# Patient Record
Sex: Female | Born: 1950
Health system: Southern US, Community
[De-identification: ages and names within clinical notes are randomized; demographics above are authoritative.]

## PROBLEM LIST (undated history)

## (undated) DIAGNOSIS — E039 Hypothyroidism, unspecified: Secondary | ICD-10-CM

## (undated) DIAGNOSIS — I10 Essential (primary) hypertension: Secondary | ICD-10-CM

## (undated) DIAGNOSIS — J45909 Unspecified asthma, uncomplicated: Secondary | ICD-10-CM

## (undated) DIAGNOSIS — E7800 Pure hypercholesterolemia, unspecified: Secondary | ICD-10-CM

## (undated) DIAGNOSIS — R0600 Dyspnea, unspecified: Secondary | ICD-10-CM

## (undated) DIAGNOSIS — K219 Gastro-esophageal reflux disease without esophagitis: Secondary | ICD-10-CM

## (undated) DIAGNOSIS — R519 Headache, unspecified: Secondary | ICD-10-CM

## (undated) DIAGNOSIS — E079 Disorder of thyroid, unspecified: Secondary | ICD-10-CM

## (undated) DIAGNOSIS — Z9114 Patient's other noncompliance with medication regimen: Secondary | ICD-10-CM

## (undated) DIAGNOSIS — J189 Pneumonia, unspecified organism: Secondary | ICD-10-CM

## (undated) DIAGNOSIS — F419 Anxiety disorder, unspecified: Secondary | ICD-10-CM

## (undated) DIAGNOSIS — Z91148 Patient's other noncompliance with medication regimen for other reason: Secondary | ICD-10-CM

## (undated) HISTORY — PX: ABDOMINAL HYSTERECTOMY: SHX81

## (undated) HISTORY — PX: BREAST LUMPECTOMY: SHX2

## (undated) HISTORY — PX: BREAST SURGERY: SHX581

## (undated) HISTORY — PX: OTHER SURGICAL HISTORY: SHX169

---

## 2003-11-28 ENCOUNTER — Ambulatory Visit (HOSPITAL_COMMUNITY): Admission: RE | Admit: 2003-11-28 | Discharge: 2003-11-28 | Payer: Self-pay | Admitting: Family Medicine

## 2005-05-15 ENCOUNTER — Emergency Department (HOSPITAL_COMMUNITY): Admission: EM | Admit: 2005-05-15 | Discharge: 2005-05-15 | Payer: Self-pay | Admitting: Emergency Medicine

## 2006-02-13 ENCOUNTER — Emergency Department (HOSPITAL_COMMUNITY): Admission: EM | Admit: 2006-02-13 | Discharge: 2006-02-14 | Payer: Self-pay | Admitting: Emergency Medicine

## 2006-02-17 ENCOUNTER — Ambulatory Visit (HOSPITAL_COMMUNITY): Admission: RE | Admit: 2006-02-17 | Discharge: 2006-02-17 | Payer: Self-pay | Admitting: Family Medicine

## 2006-02-28 ENCOUNTER — Ambulatory Visit (HOSPITAL_COMMUNITY): Admission: RE | Admit: 2006-02-28 | Discharge: 2006-02-28 | Payer: Self-pay | Admitting: Family Medicine

## 2007-08-09 ENCOUNTER — Emergency Department (HOSPITAL_COMMUNITY): Admission: EM | Admit: 2007-08-09 | Discharge: 2007-08-09 | Payer: Self-pay | Admitting: Emergency Medicine

## 2007-09-22 ENCOUNTER — Ambulatory Visit (HOSPITAL_COMMUNITY): Admission: RE | Admit: 2007-09-22 | Discharge: 2007-09-22 | Payer: Self-pay | Admitting: Preventative Medicine

## 2008-03-22 ENCOUNTER — Ambulatory Visit (HOSPITAL_COMMUNITY): Admission: RE | Admit: 2008-03-22 | Discharge: 2008-03-22 | Payer: Self-pay | Admitting: Family Medicine

## 2008-04-09 ENCOUNTER — Encounter: Admission: RE | Admit: 2008-04-09 | Discharge: 2008-04-09 | Payer: Self-pay | Admitting: Family Medicine

## 2008-06-18 ENCOUNTER — Ambulatory Visit (HOSPITAL_COMMUNITY): Admission: RE | Admit: 2008-06-18 | Discharge: 2008-06-18 | Payer: Self-pay | Admitting: Family Medicine

## 2008-06-26 ENCOUNTER — Emergency Department (HOSPITAL_COMMUNITY): Admission: EM | Admit: 2008-06-26 | Discharge: 2008-06-26 | Payer: Self-pay | Admitting: Emergency Medicine

## 2008-09-16 ENCOUNTER — Ambulatory Visit (HOSPITAL_COMMUNITY): Admission: RE | Admit: 2008-09-16 | Discharge: 2008-09-16 | Payer: Self-pay | Admitting: Family Medicine

## 2008-09-19 ENCOUNTER — Ambulatory Visit (HOSPITAL_COMMUNITY): Admission: RE | Admit: 2008-09-19 | Discharge: 2008-09-19 | Payer: Self-pay | Admitting: Family Medicine

## 2009-05-06 ENCOUNTER — Inpatient Hospital Stay (HOSPITAL_COMMUNITY): Admission: EM | Admit: 2009-05-06 | Discharge: 2009-05-08 | Payer: Self-pay | Admitting: Emergency Medicine

## 2009-07-04 ENCOUNTER — Encounter: Admission: RE | Admit: 2009-07-04 | Discharge: 2009-07-04 | Payer: Self-pay | Admitting: Family Medicine

## 2010-01-08 ENCOUNTER — Encounter (INDEPENDENT_AMBULATORY_CARE_PROVIDER_SITE_OTHER): Payer: Self-pay | Admitting: *Deleted

## 2010-01-20 DIAGNOSIS — K219 Gastro-esophageal reflux disease without esophagitis: Secondary | ICD-10-CM | POA: Insufficient documentation

## 2010-01-20 DIAGNOSIS — R1310 Dysphagia, unspecified: Secondary | ICD-10-CM | POA: Insufficient documentation

## 2010-01-20 DIAGNOSIS — I10 Essential (primary) hypertension: Secondary | ICD-10-CM | POA: Insufficient documentation

## 2010-01-20 DIAGNOSIS — F411 Generalized anxiety disorder: Secondary | ICD-10-CM | POA: Insufficient documentation

## 2010-01-21 ENCOUNTER — Encounter: Payer: Self-pay | Admitting: Urgent Care

## 2010-01-21 ENCOUNTER — Ambulatory Visit: Payer: Self-pay | Admitting: Internal Medicine

## 2010-01-21 DIAGNOSIS — D509 Iron deficiency anemia, unspecified: Secondary | ICD-10-CM | POA: Insufficient documentation

## 2010-01-21 DIAGNOSIS — E7800 Pure hypercholesterolemia, unspecified: Secondary | ICD-10-CM | POA: Insufficient documentation

## 2010-01-21 DIAGNOSIS — E119 Type 2 diabetes mellitus without complications: Secondary | ICD-10-CM | POA: Insufficient documentation

## 2010-01-21 DIAGNOSIS — K921 Melena: Secondary | ICD-10-CM | POA: Insufficient documentation

## 2010-01-22 ENCOUNTER — Encounter: Payer: Self-pay | Admitting: Urgent Care

## 2010-01-26 ENCOUNTER — Ambulatory Visit: Payer: Self-pay | Admitting: Internal Medicine

## 2010-01-26 ENCOUNTER — Ambulatory Visit (HOSPITAL_COMMUNITY): Admission: RE | Admit: 2010-01-26 | Discharge: 2010-01-26 | Payer: Self-pay | Admitting: Internal Medicine

## 2010-01-26 LAB — CONVERTED CEMR LAB
Basophils Absolute: 0 10*3/uL (ref 0.0–0.1)
Basophils Relative: 0 % (ref 0–1)
Eosinophils Absolute: 0.2 10*3/uL (ref 0.0–0.7)
Eosinophils Relative: 3 % (ref 0–5)
HCT: 30.9 % — ABNORMAL LOW (ref 36.0–46.0)
Hemoglobin: 9.8 g/dL — ABNORMAL LOW (ref 12.0–15.0)
Lymphocytes Relative: 30 % (ref 12–46)
Lymphs Abs: 1.9 10*3/uL (ref 0.7–4.0)
MCHC: 31.7 g/dL (ref 30.0–36.0)
MCV: 82.4 fL (ref 78.0–100.0)
Monocytes Absolute: 0.3 10*3/uL (ref 0.1–1.0)
Monocytes Relative: 5 % (ref 3–12)
Neutro Abs: 4 10*3/uL (ref 1.7–7.7)
Neutrophils Relative %: 62 % (ref 43–77)
Platelets: 284 10*3/uL (ref 150–400)
RBC: 3.75 M/uL — ABNORMAL LOW (ref 3.87–5.11)
RDW: 14.2 % (ref 11.5–15.5)
WBC: 6.5 10*3/uL (ref 4.0–10.5)

## 2010-01-29 ENCOUNTER — Encounter: Payer: Self-pay | Admitting: Internal Medicine

## 2010-02-02 ENCOUNTER — Telehealth (INDEPENDENT_AMBULATORY_CARE_PROVIDER_SITE_OTHER): Payer: Self-pay

## 2010-02-24 ENCOUNTER — Emergency Department (HOSPITAL_COMMUNITY): Admission: EM | Admit: 2010-02-24 | Discharge: 2010-02-24 | Payer: Self-pay | Admitting: Emergency Medicine

## 2010-05-14 ENCOUNTER — Telehealth: Payer: Self-pay | Admitting: Gastroenterology

## 2010-05-14 ENCOUNTER — Ambulatory Visit (HOSPITAL_COMMUNITY): Admission: RE | Admit: 2010-05-14 | Discharge: 2010-05-14 | Payer: Self-pay | Admitting: Family Medicine

## 2010-05-15 ENCOUNTER — Ambulatory Visit (HOSPITAL_COMMUNITY): Admission: RE | Admit: 2010-05-15 | Discharge: 2010-05-15 | Payer: Self-pay | Admitting: Family Medicine

## 2010-05-18 ENCOUNTER — Encounter: Payer: Self-pay | Admitting: Internal Medicine

## 2010-05-19 ENCOUNTER — Emergency Department (HOSPITAL_COMMUNITY): Admission: EM | Admit: 2010-05-19 | Discharge: 2010-05-19 | Payer: Self-pay | Admitting: Emergency Medicine

## 2010-05-26 ENCOUNTER — Encounter (INDEPENDENT_AMBULATORY_CARE_PROVIDER_SITE_OTHER): Payer: Self-pay

## 2010-06-12 ENCOUNTER — Encounter (INDEPENDENT_AMBULATORY_CARE_PROVIDER_SITE_OTHER): Payer: Self-pay | Admitting: *Deleted

## 2010-10-19 ENCOUNTER — Emergency Department (HOSPITAL_COMMUNITY): Admission: EM | Admit: 2010-10-19 | Discharge: 2010-10-19 | Payer: Self-pay | Admitting: Emergency Medicine

## 2010-12-02 ENCOUNTER — Emergency Department (HOSPITAL_COMMUNITY)
Admission: EM | Admit: 2010-12-02 | Discharge: 2010-12-02 | Payer: Self-pay | Source: Home / Self Care | Admitting: Emergency Medicine

## 2010-12-07 LAB — BASIC METABOLIC PANEL
BUN: 7 mg/dL (ref 6–23)
CO2: 24 mEq/L (ref 19–32)
Calcium: 8.9 mg/dL (ref 8.4–10.5)
Chloride: 105 mEq/L (ref 96–112)
Creatinine, Ser: 0.7 mg/dL (ref 0.4–1.2)
GFR calc Af Amer: >60 mL/min (ref >60)
GFR calc non Af Amer: >60 mL/min (ref >60)
Glucose, Bld: 141 mg/dL — ABNORMAL HIGH (ref 70–99)
Potassium: 3.5 mEq/L (ref 3.5–5.1)
Sodium: 139 mEq/L (ref 135–145)

## 2010-12-07 LAB — DIFFERENTIAL
Basophils Absolute: 0 10*3/uL (ref 0.0–0.1)
Basophils Relative: 0 % (ref 0–1)
Eosinophils Absolute: 0.1 10*3/uL (ref 0.0–0.7)
Eosinophils Relative: 2 % (ref 0–5)
Lymphocytes Relative: 34 % (ref 12–46)
Lymphs Abs: 2.2 10*3/uL (ref 0.7–4.0)
Monocytes Absolute: 0.4 10*3/uL (ref 0.1–1.0)
Monocytes Relative: 6 % (ref 3–12)
Neutro Abs: 3.9 10*3/uL (ref 1.7–7.7)
Neutrophils Relative %: 59 % (ref 43–77)

## 2010-12-07 LAB — CBC
HCT: 28.4 % — ABNORMAL LOW (ref 36.0–46.0)
Hemoglobin: 9.6 g/dL — ABNORMAL LOW (ref 12.0–15.0)
MCH: 26.7 pg (ref 26.0–34.0)
MCHC: 33.8 g/dL (ref 30.0–36.0)
MCV: 79.1 fL (ref 78.0–100.0)
Platelets: 259 10*3/uL (ref 150–400)
RBC: 3.59 MIL/uL — ABNORMAL LOW (ref 3.87–5.11)
RDW: 14.7 % (ref 11.5–15.5)
WBC: 6.6 10*3/uL (ref 4.0–10.5)

## 2010-12-07 LAB — GLUCOSE, CAPILLARY: Glucose-Capillary: 174 mg/dL — ABNORMAL HIGH (ref 70–99)

## 2010-12-24 NOTE — Letter (Signed)
Summary: Appointment Reminder  [facility]  [address]   [place], [place] [address]   Phone: [phone]  Fax: [phone]       January 08, 2010   MADDALYN LUTZE [address] Rhina Brackett [place], [place]  [address] [birth year]    Dear Ms. Bolduc,  We have been unable to reach you by phone to schedule a follow up   appointment that was recommended for you by Dr. Jena Gauss. It is very   important that we reach you to schedule an appointment. We hope that you  allow [place] to participate in your health care needs. Please contact us at  [phone] at your earliest convenience to schedule your appointment.  Sincerely,    [facility] R. Roetta Sessions, M.D.    Kassie Mends, M.D. Lorenza Burton, FNP-BC    Tana Coast, PA-C Phone: [phone]    Fax: [phone]

## 2010-12-24 NOTE — Letter (Signed)
Summary: tcs/egd order  tcs/egd order   Imported By: Ave Filter 01/21/2010 11:39:51  _____________________________________________________________________  External Attachment:    Type:   Image     Comment:   External Document

## 2010-12-24 NOTE — Assessment & Plan Note (Signed)
Summary: e30,heme+stool,glu   Visit Type:  Initial Consult Referring Provider:  Sherwood Gambler Primary Care Provider:  [place]  Chief Complaint:  heme positive stool.  History of Present Illness: 60 y/o caucasian female referred for anemia/heme positive stool.  Last hgb 10.2, normal MCV on 11/11/2009.  Started iron x 2 weeks now.  Found heme positive on cards from Dr. Sharyon Medicus office.  Denies abd pain, nausea, vomiting.  Daily heartburn & indigestion.  Had been on PPI yrs ago, but got too expensive.  Takes TUMS.  Occ regurg.  c/o dysphagia w/ liquids, feels like comes back up.  Denies any problems w/ solids.  BM normal 2-3 per day, denies rectal bleeding or melena.  Never had colonoscopy.  Wt steadily increasing.  c/o L4-5 bulging disc w/ sciatica.  Takes Arthritis BC's at least 3-4 times per week.  Aleve 1 couple times per wk.  hx anemia w/ menses, but not recently.  Gives blood regularly, tried last yr, was told iron was low.  Never had transfusion.  c/o abd bloating.  B12, folate, TSH normal iron 37 UIBC 441, TIBC 478 (low), %sat( low) 8, hgb A1c 6.9 CMP normal x glucose 133  Current Problems (verified): 1)  Hypercholesterolemia  (ICD-272.0) 2)  Dm  (ICD-250.00) 3)  Hemoccult Positive Stool  (ICD-578.1) 4)  Anemia, Iron Deficiency  (ICD-280.9) 5)  Anxiety Neurosis  (ICD-300.00) 6)  Hypertension  (ICD-401.9) 7)  Gerd  (ICD-530.81) 8)  Dysphagia Unspecified  (ICD-787.20)  Current Medications (verified): 1)  Hydrocodone-Acetaminophen 10-500 Mg Tabs (Hydrocodone-Acetaminophen) .... As Needed 2)  Tricor 145 Mg Tabs (Fenofibrate) .... Take 1 Tablet By Mouth Once A Day 3)  Alprazolam 0.5 Mg Tabs (Alprazolam) .... As Needed 4)  Lexapro 10 Mg Tabs (Escitalopram Oxalate) .... Take 1 Tablet By Mouth Once A Day 5)  Neurontin 100 Mg Caps (Gabapentin) .... Take 1 Tablet By Mouth Three Times A Day 6)  Methocarbamol 500 Mg Tabs (Methocarbamol) .... As Needed 7)  Amlodipine Besylate 5 Mg Tabs (Amlodipine  Besylate) .... Take 1 Tablet By Mouth Once A Day 8)  Metformin Hcl 500 Mg Tabs (Metformin Hcl) .... Two Tablets in The Am and Two Tablets in The Pm 9)  Benicar Hct 40-12.5 Mg Tabs (Olmesartan Medoxomil-Hctz) .... Take 1 Tablet By Mouth Once A Day 10)  Zetia 10 Mg Tabs (Ezetimibe) .... Take 1 Tablet By Mouth Once A Day 11)  Zolpidem Tartrate 10 Mg Tabs (Zolpidem Tartrate) .... As Needed 12)  Aleve 220 Mg Tabs (Naproxen Sodium) .Marland Kitchen.. 1 Qdaily As Needed For Arthritis 13)  Bc Fast Pain Relief Arthritis 742-222-38 Mg Pack (Aspirin-Salicylamide-Caffeine) .Marland Kitchen.. 1-2 Per Day As Needed For Knee Pain 14)  Tums 500 Mg Chew (Calcium Carbonate Antacid) .... Prn 15)  Iron Supplement 325 (65 Fe) Mg Tabs (Ferrous Sulfate) .... One By Mouth Daily  Allergies (verified): 1)  ! Sulfa  Past History:  Past Medical History: HYPERCHOLESTEROLEMIA (ICD-272.0) DM (ICD-250.00) HEMOCCULT POSITIVE STOOL (ICD-578.1) ANEMIA, IRON DEFICIENCY (ICD-280.9) ANXIETY NEUROSIS (ICD-300.00) HYPERTENSION (ICD-401.9) GERD (ICD-530.81) DYSPHAGIA UNSPECIFIED (ICD-787.20) EGD 1998->clo test negative, Schatski ring, small antral erosions  Past Surgical History: C-SECTIONS X 2 BREAST CYST REMOVAL, benign left ankle surgery  Family History: No known family history of colorectal carcinoma 1 daughter w/ ?autoimmune hepatitis Father: (deceased 31) CAD, alcohol abuse, DM Mother: (61) Crohn's disease Siblings: 3  brothers-htn, colon polyps 2 sisters-healthy  Social History: widow, lives alone [employer] full-time 1 daughter, 1 son-healthy Patient has never smoked.  Alcohol Use - no Daily Caffeine  Use Illicit Drug Use - no Patient does not get regular exercise.  Smoking Status:  never Drug Use:  no Does Patient Exercise:  no  Review of Systems General:  Denies fever, chills, sweats, anorexia, fatigue, weakness, malaise, weight loss, and sleep disorder. CV:  Denies chest pains, angina, palpitations, syncope, dyspnea on  exertion, orthopnea, PND, peripheral edema, and claudication. Resp:  Denies dyspnea at rest, dyspnea with exercise, cough, sputum, wheezing, coughing up blood, and pleurisy. GI:  Denies vomiting blood, abdominal pain, jaundice, change in bowel habits, and fecal incontinence. GU:  Denies urinary burning, blood in urine, nocturnal urination, urinary frequency, urinary incontinence, and abnormal vaginal bleeding; post-menopausal. Derm:  Denies rash, itching, dry skin, hives, moles, warts, and unhealing ulcers. Psych:  Denies depression, anxiety, memory loss, suicidal ideation, hallucinations, paranoia, phobia, and confusion. Heme:  Denies bruising, bleeding, and enlarged lymph nodes.  Vital Signs:  Patient profile:   60 year old female Height:      59 inches Weight:      185 pounds BMI:     37.50 Temp:     98.2 degrees F oral Pulse rate:   80 / minute BP sitting:   160 / 90  (left arm) Cuff size:   large  Vitals Entered By: Cloria Spring LPN (January 21, 9561 10:28 AM)   Physical Exam  General:  obese.  Well developed, no acute distress. Head:  Normocephalic and atraumatic. Eyes:  Sclera clear, no icterus. Ears:  Normal auditory acuity. Nose:  No deformity, discharge,  or lesions. Mouth:  No deformity or lesions, dentition normal. Neck:  Supple; no masses or thyromegaly. Lungs:  Clear throughout to auscultation. Heart:  Regular rate and rhythm; no murmurs, rubs,  or bruits. Abdomen:  Rash around mid-abd w/ scaling.  Soft, nontender and nondistended. No masses, hepatosplenomegaly or hernias noted. Normal bowel sounds.obese, without guarding, and without rebound.  exam limited given body habitus Msk:  Symmetrical with no gross deformities. Normal posture. Pulses:  Normal pulses noted. Extremities:  1+ pedal edema.   Neurologic:  Alert and  oriented x4;  grossly normal neurologically. Skin:  Intact without significant lesions or rashes. Cervical Nodes:  No significant cervical  adenopathy. Psych:  Alert and cooperative. Normal mood and affect.  Impression & Recommendations:  Problem # 1:  ANEMIA, IRON DEFICIENCY (ICD-280.9) 60 y/o obese caucasian female w/ anemia, hemoccult positive stool & GERD not on PPI.  Frequent NSAIDs.  Will need further evaluation to r/o NSAID-induced enteropathy, occult malignancy, & PUD.    Colonoscopy plus/minus EGD to be performed by Dr. Jonathon Bellows in the near future.  I have discussed risks and benefits which include, but are not limited to, bleeding, infection, perforation, or medication reaction.  The patient agrees with this plan and consent will be obtained.  Orders: T-CBC w/Diff ([id number]) Consultation Level III ([address])  Problem # 2:  HEMOCCULT POSITIVE STOOL (ICD-578.1) See #1  Problem # 3:  GERD (ICD-530.81) See #1  Patient Instructions: 1)  Go get your labs today 2)  Start prilosec 20 mg daily (samples given) 30 mins before breakfast for acid reflux 3)  No iron for 7 days before your procedure 4)  Change back to your regular detergents, if rash persists let Dr Sherwood Gambler know 5)  See Dr Sherwood Gambler regarding your swelling in your lower legs 6)  No ALEVE, IBUPROFEN, GOODYs, BC powders for now. 7)  The medication list was reviewed and reconciled.  All changed / newly prescribed medications  were explained.  A complete medication list was provided to the patient / caregiver.
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
## 2010-12-24 NOTE — Letter (Signed)
Summary: Recall Colonoscopy/Endoscopy, Change to Office Visit  [facility]  [address]   [place], [place] [address]   Phone: [phone]  Fax: [phone]      May 26, 2010   [name] [address] [place], [place]  [address] 1951-03-14   Dear Ms. Meter,   According to our records, it is time for you to schedule a Colonoscopy/Endoscopy. However, after reviewing your medical record, we recommend an office visit in order to determine your need for a repeat procedure.  Please call [phone] at your convenience to schedule an office visit. If you have any questions or concerns, please feel free to contact our office.   Sincerely,   Cloria Spring LPN  [facility] Ph: [phone]   Fax: [phone]

## 2010-12-24 NOTE — Letter (Signed)
Summary: Generic Letter, Intro to Referring  [facility]  [address]   [place], [place] [address]   Phone: [phone]  Fax: [phone]      June 12, 2010                RE: [name]   [birth year]                 [address]                 [place], [place]  [address]   Dear Ms. Bene,   Our office has been unable to contact you by phone.  Your primary care doctor would like you to schedule an appointment with [place].  Please contact our office at [phone].             Sincerely,    Elinor Parkinson  [facility] Ph: [phone]   Fax: [phone]

## 2010-12-24 NOTE — Letter (Signed)
Summary: [facility]  [facility]   Imported By: Diana Eves 01/22/2010 09:54:30  _____________________________________________________________________  External Attachment:    Type:   Image     Comment:   External Document

## 2010-12-24 NOTE — Letter (Signed)
Summary: Patient Notice, Endo Biopsy Results  [facility]  [address]   [place], [place] [address]   Phone: [phone]  Fax: [phone]       January 29, 2010   [name] [address] [place], [place]  [address] 01-09-1951    Dear [name],  I am pleased to inform you that the biopsies taken during your recent endoscopic examination did not show any evidence of cancer upon pathologic examination.  They did show inflammation but no evidence of infection.  Additional information/recommendations:  Continue with the treatment plan as outlined on the day of your exam.  You should have a repeat endoscopic examination in 3 months.   Please call us if you are having persistent problems or have questions about your condition that have not been fully answered at this time.  Sincerely,    R. Roetta Sessions MD  [facility] Ph: [phone]   Fax: [phone]   Appended Document: Patient Notice, Endo Biopsy Results Letter mailed.

## 2010-12-24 NOTE — Progress Notes (Signed)
----   Converted from flag ---- ---- 02/02/2010 1:58 PM, Jonathon Bellows MD wrote: for screening purposes, would be 10 years.  ---- 02/02/2010 10:16 AM, Cloria Spring LPN wrote: Dr. Jena Gauss, when should pt have next TCS? ------------------------------

## 2010-12-24 NOTE — Letter (Signed)
Summary: radiology report  radiology report   Imported By: Rosine Beat 05/18/2010 10:35:30  _____________________________________________________________________  External Attachment:    Type:   Image     Comment:   External Document

## 2011-01-07 ENCOUNTER — Other Ambulatory Visit (HOSPITAL_COMMUNITY): Payer: Self-pay | Admitting: Internal Medicine

## 2011-01-07 DIAGNOSIS — N83209 Unspecified ovarian cyst, unspecified side: Secondary | ICD-10-CM

## 2011-01-11 ENCOUNTER — Other Ambulatory Visit (HOSPITAL_COMMUNITY): Payer: Self-pay

## 2011-01-14 ENCOUNTER — Ambulatory Visit (HOSPITAL_COMMUNITY): Admission: RE | Admit: 2011-01-14 | Payer: BC Managed Care – PPO | Source: Ambulatory Visit

## 2011-01-15 ENCOUNTER — Ambulatory Visit (HOSPITAL_COMMUNITY)
Admission: RE | Admit: 2011-01-15 | Discharge: 2011-01-15 | Disposition: A | Discharge status: Home / Self Care | Payer: BC Managed Care – PPO | Source: Ambulatory Visit | Attending: Internal Medicine | Admitting: Internal Medicine

## 2011-01-15 DIAGNOSIS — N83209 Unspecified ovarian cyst, unspecified side: Secondary | ICD-10-CM | POA: Insufficient documentation

## 2011-01-15 DIAGNOSIS — R9389 Abnormal findings on diagnostic imaging of other specified body structures: Secondary | ICD-10-CM | POA: Insufficient documentation

## 2011-01-19 ENCOUNTER — Emergency Department (HOSPITAL_COMMUNITY)
Admission: EM | Admit: 2011-01-19 | Discharge: 2011-01-19 | Disposition: A | Discharge status: Home / Self Care | Payer: BC Managed Care – PPO | Attending: Emergency Medicine | Admitting: Emergency Medicine

## 2011-01-19 ENCOUNTER — Emergency Department (HOSPITAL_COMMUNITY): Payer: BC Managed Care – PPO

## 2011-01-19 DIAGNOSIS — Z79899 Other long term (current) drug therapy: Secondary | ICD-10-CM | POA: Insufficient documentation

## 2011-01-19 DIAGNOSIS — I10 Essential (primary) hypertension: Secondary | ICD-10-CM | POA: Insufficient documentation

## 2011-01-19 DIAGNOSIS — F410 Panic disorder [episodic paroxysmal anxiety] without agoraphobia: Secondary | ICD-10-CM | POA: Insufficient documentation

## 2011-01-19 DIAGNOSIS — E119 Type 2 diabetes mellitus without complications: Secondary | ICD-10-CM | POA: Insufficient documentation

## 2011-01-19 DIAGNOSIS — R42 Dizziness and giddiness: Secondary | ICD-10-CM | POA: Insufficient documentation

## 2011-01-19 DIAGNOSIS — E785 Hyperlipidemia, unspecified: Secondary | ICD-10-CM | POA: Insufficient documentation

## 2011-01-19 DIAGNOSIS — R4789 Other speech disturbances: Secondary | ICD-10-CM | POA: Insufficient documentation

## 2011-01-19 DIAGNOSIS — R209 Unspecified disturbances of skin sensation: Secondary | ICD-10-CM | POA: Insufficient documentation

## 2011-01-19 DIAGNOSIS — M549 Dorsalgia, unspecified: Secondary | ICD-10-CM | POA: Insufficient documentation

## 2011-01-19 LAB — COMPREHENSIVE METABOLIC PANEL
ALT: 14 U/L (ref 0–35)
AST: 21 U/L (ref 0–37)
Albumin: 4.2 g/dL (ref 3.5–5.2)
Alkaline Phosphatase: 72 U/L (ref 39–117)
BUN: 14 mg/dL (ref 6–23)
CO2: 21 mEq/L (ref 19–32)
Calcium: 9.7 mg/dL (ref 8.4–10.5)
Chloride: 109 mEq/L (ref 96–112)
Creatinine, Ser: 0.91 mg/dL (ref 0.4–1.2)
GFR calc Af Amer: >60 mL/min (ref >60)
GFR calc non Af Amer: >60 mL/min (ref >60)
Glucose, Bld: 134 mg/dL — ABNORMAL HIGH (ref 70–99)
Potassium: 3.2 mEq/L — ABNORMAL LOW (ref 3.5–5.1)
Sodium: 143 mEq/L (ref 135–145)
Total Bilirubin: 0.3 mg/dL (ref 0.3–1.2)
Total Protein: 7.8 g/dL (ref 6.0–8.3)

## 2011-01-19 LAB — PROTIME-INR
INR: 0.97 (ref 0.00–1.49)
Prothrombin Time: 13.1 seconds (ref 11.6–15.2)

## 2011-01-19 LAB — CBC
HCT: 35.2 % — ABNORMAL LOW (ref 36.0–46.0)
Hemoglobin: 11.5 g/dL — ABNORMAL LOW (ref 12.0–15.0)
MCH: 26 pg (ref 26.0–34.0)
MCHC: 32.7 g/dL (ref 30.0–36.0)
MCV: 79.6 fL (ref 78.0–100.0)
Platelets: 367 10*3/uL (ref 150–400)
RBC: 4.42 MIL/uL (ref 3.87–5.11)
RDW: 15.2 % (ref 11.5–15.5)
WBC: 9.4 10*3/uL (ref 4.0–10.5)

## 2011-01-19 LAB — DIFFERENTIAL
Basophils Absolute: 0 10*3/uL (ref 0.0–0.1)
Basophils Relative: 0 % (ref 0–1)
Eosinophils Absolute: 0 10*3/uL (ref 0.0–0.7)
Eosinophils Relative: 0 % (ref 0–5)
Lymphocytes Relative: 30 % (ref 12–46)
Lymphs Abs: 2.8 10*3/uL (ref 0.7–4.0)
Monocytes Absolute: 0.7 10*3/uL (ref 0.1–1.0)
Monocytes Relative: 8 % (ref 3–12)
Neutro Abs: 5.9 10*3/uL (ref 1.7–7.7)
Neutrophils Relative %: 62 % (ref 43–77)

## 2011-01-19 LAB — POCT CARDIAC MARKERS
CKMB, poc: <1 ng/mL — ABNORMAL LOW (ref 1.0–8.0)
Myoglobin, poc: 92.6 ng/mL (ref 12–200)
Troponin i, poc: <0.05 ng/mL (ref 0.00–0.09)

## 2011-01-19 LAB — URINALYSIS, ROUTINE W REFLEX MICROSCOPIC
Bilirubin Urine: NEGATIVE
Hgb urine dipstick: NEGATIVE
Ketones, ur: NEGATIVE mg/dL
Nitrite: NEGATIVE
Protein, ur: NEGATIVE mg/dL
Specific Gravity, Urine: >1.03 — ABNORMAL HIGH (ref 1.005–1.030)
Urine Glucose, Fasting: NEGATIVE mg/dL
Urobilinogen, UA: 0.2 mg/dL (ref 0.0–1.0)
pH: 5 (ref 5.0–8.0)

## 2011-01-19 LAB — APTT: aPTT: 27 seconds (ref 24–37)

## 2011-02-02 LAB — POCT I-STAT, CHEM 8
BUN: 13 mg/dL (ref 6–23)
Calcium, Ion: 1.14 mmol/L (ref 1.12–1.32)
Chloride: 106 mEq/L (ref 96–112)
Creatinine, Ser: 0.7 mg/dL (ref 0.4–1.2)
Glucose, Bld: 152 mg/dL — ABNORMAL HIGH (ref 70–99)
HCT: 31 % — ABNORMAL LOW (ref 36.0–46.0)
Hemoglobin: 10.5 g/dL — ABNORMAL LOW (ref 12.0–15.0)
Potassium: 3.6 mEq/L (ref 3.5–5.1)
Sodium: 142 mEq/L (ref 135–145)
TCO2: 25 mmol/L (ref 0–100)

## 2011-02-07 LAB — BASIC METABOLIC PANEL
BUN: 27 mg/dL — ABNORMAL HIGH (ref 6–23)
CO2: 22 mEq/L (ref 19–32)
Calcium: 9.9 mg/dL (ref 8.4–10.5)
Chloride: 104 mEq/L (ref 96–112)
Creatinine, Ser: 1.13 mg/dL (ref 0.4–1.2)
GFR calc Af Amer: 60 mL/min — ABNORMAL LOW (ref >60)
GFR calc non Af Amer: 49 mL/min — ABNORMAL LOW (ref >60)
Glucose, Bld: 299 mg/dL — ABNORMAL HIGH (ref 70–99)
Potassium: 4 mEq/L (ref 3.5–5.1)
Sodium: 136 mEq/L (ref 135–145)

## 2011-02-07 LAB — ETHANOL: Alcohol, Ethyl (B): <5 mg/dL (ref 0–10)

## 2011-02-07 LAB — DIFFERENTIAL
Basophils Absolute: 0 10*3/uL (ref 0.0–0.1)
Basophils Relative: 0 % (ref 0–1)
Eosinophils Absolute: 0.1 10*3/uL (ref 0.0–0.7)
Eosinophils Relative: 1 % (ref 0–5)
Lymphocytes Relative: 15 % (ref 12–46)
Lymphs Abs: 1.4 10*3/uL (ref 0.7–4.0)
Monocytes Absolute: 0.3 10*3/uL (ref 0.1–1.0)
Monocytes Relative: 3 % (ref 3–12)
Neutro Abs: 8.1 10*3/uL — ABNORMAL HIGH (ref 1.7–7.7)
Neutrophils Relative %: 82 % — ABNORMAL HIGH (ref 43–77)

## 2011-02-07 LAB — CBC
HCT: 31.8 % — ABNORMAL LOW (ref 36.0–46.0)
Hemoglobin: 10.7 g/dL — ABNORMAL LOW (ref 12.0–15.0)
MCH: 27.2 pg (ref 26.0–34.0)
MCHC: 33.5 g/dL (ref 30.0–36.0)
MCV: 81 fL (ref 78.0–100.0)
Platelets: 329 10*3/uL (ref 150–400)
RBC: 3.92 MIL/uL (ref 3.87–5.11)
RDW: 15.7 % — ABNORMAL HIGH (ref 11.5–15.5)
WBC: 9.9 10*3/uL (ref 4.0–10.5)

## 2011-02-15 LAB — GLUCOSE, CAPILLARY: Glucose-Capillary: 164 mg/dL — ABNORMAL HIGH (ref 70–99)

## 2011-03-01 ENCOUNTER — Encounter (HOSPITAL_COMMUNITY): Payer: BC Managed Care – PPO

## 2011-03-01 LAB — RETICULOCYTES
RBC.: 3.62 MIL/uL — ABNORMAL LOW (ref 3.87–5.11)
Retic Count, Absolute: 54.3 10*3/uL (ref 19.0–186.0)
Retic Ct Pct: 1.5 % (ref 0.4–3.1)

## 2011-03-01 LAB — URINALYSIS, ROUTINE W REFLEX MICROSCOPIC
Bilirubin Urine: NEGATIVE
Bilirubin Urine: NEGATIVE
Glucose, UA: NEGATIVE mg/dL
Glucose, UA: NEGATIVE mg/dL
Hgb urine dipstick: NEGATIVE
Hgb urine dipstick: NEGATIVE
Ketones, ur: NEGATIVE mg/dL
Ketones, ur: NEGATIVE mg/dL
Nitrite: NEGATIVE
Nitrite: NEGATIVE
Protein, ur: NEGATIVE mg/dL
Specific Gravity, Urine: 1.025 (ref 1.005–1.030)
Specific Gravity, Urine: 1.02 (ref 1.005–1.030)
Urobilinogen, UA: 0.2 mg/dL (ref 0.0–1.0)
Urobilinogen, UA: 0.2 mg/dL (ref 0.0–1.0)
pH: 5.5 (ref 5.0–8.0)
pH: 5 (ref 5.0–8.0)

## 2011-03-01 LAB — CBC
HCT: 35.1 % — ABNORMAL LOW (ref 36.0–46.0)
HCT: 26.7 % — ABNORMAL LOW (ref 36.0–46.0)
HCT: 27.2 % — ABNORMAL LOW (ref 36.0–46.0)
Hemoglobin: 11.6 g/dL — ABNORMAL LOW (ref 12.0–15.0)
Hemoglobin: 9.2 g/dL — ABNORMAL LOW (ref 12.0–15.0)
Hemoglobin: 9.3 g/dL — ABNORMAL LOW (ref 12.0–15.0)
MCH: 26.2 pg (ref 26.0–34.0)
MCHC: 33 g/dL (ref 30.0–36.0)
MCHC: 34.4 g/dL (ref 30.0–36.0)
MCHC: 34.3 g/dL (ref 30.0–36.0)
MCV: 79.4 fL (ref 78.0–100.0)
MCV: 77.4 fL — ABNORMAL LOW (ref 78.0–100.0)
MCV: 77.7 fL — ABNORMAL LOW (ref 78.0–100.0)
Platelets: 266 10*3/uL (ref 150–400)
Platelets: 191 10*3/uL (ref 150–400)
Platelets: 214 10*3/uL (ref 150–400)
RBC: 4.42 MIL/uL (ref 3.87–5.11)
RBC: 3.44 MIL/uL — ABNORMAL LOW (ref 3.87–5.11)
RBC: 3.5 MIL/uL — ABNORMAL LOW (ref 3.87–5.11)
RDW: 15.3 % (ref 11.5–15.5)
RDW: 15.7 % — ABNORMAL HIGH (ref 11.5–15.5)
RDW: 15.4 % (ref 11.5–15.5)
WBC: 7.1 10*3/uL (ref 4.0–10.5)
WBC: 6.9 10*3/uL (ref 4.0–10.5)
WBC: 8.6 10*3/uL (ref 4.0–10.5)

## 2011-03-01 LAB — URINE CULTURE: Colony Count: 9000

## 2011-03-01 LAB — GLUCOSE, CAPILLARY
Glucose-Capillary: 128 mg/dL — ABNORMAL HIGH (ref 70–99)
Glucose-Capillary: 144 mg/dL — ABNORMAL HIGH (ref 70–99)
Glucose-Capillary: 162 mg/dL — ABNORMAL HIGH (ref 70–99)
Glucose-Capillary: 166 mg/dL — ABNORMAL HIGH (ref 70–99)
Glucose-Capillary: 166 mg/dL — ABNORMAL HIGH (ref 70–99)
Glucose-Capillary: 243 mg/dL — ABNORMAL HIGH (ref 70–99)
Glucose-Capillary: 117 mg/dL — ABNORMAL HIGH (ref 70–99)
Glucose-Capillary: 140 mg/dL — ABNORMAL HIGH (ref 70–99)

## 2011-03-01 LAB — FERRITIN

## 2011-03-01 LAB — COMPREHENSIVE METABOLIC PANEL
ALT: 14 U/L (ref 0–35)
ALT: 36 U/L — ABNORMAL HIGH (ref 0–35)
AST: 15 U/L (ref 0–37)
AST: 50 U/L — ABNORMAL HIGH (ref 0–37)
Albumin: 4 g/dL (ref 3.5–5.2)
Albumin: 3.6 g/dL (ref 3.5–5.2)
Alkaline Phosphatase: 72 U/L (ref 39–117)
Alkaline Phosphatase: 84 U/L (ref 39–117)
BUN: 12 mg/dL (ref 6–23)
BUN: 21 mg/dL (ref 6–23)
CO2: 28 mEq/L (ref 19–32)
CO2: 25 mEq/L (ref 19–32)
Calcium: 9.7 mg/dL (ref 8.4–10.5)
Calcium: 8.5 mg/dL (ref 8.4–10.5)
Chloride: 99 mEq/L (ref 96–112)
Chloride: 102 mEq/L (ref 96–112)
Creatinine, Ser: 0.71 mg/dL (ref 0.4–1.2)
Creatinine, Ser: 1.23 mg/dL — ABNORMAL HIGH (ref 0.4–1.2)
GFR calc Af Amer: >60 mL/min (ref >60)
GFR calc Af Amer: 54 mL/min — ABNORMAL LOW (ref >60)
GFR calc non Af Amer: >60 mL/min (ref >60)
GFR calc non Af Amer: 45 mL/min — ABNORMAL LOW (ref >60)
Glucose, Bld: 182 mg/dL — ABNORMAL HIGH (ref 70–99)
Glucose, Bld: 152 mg/dL — ABNORMAL HIGH (ref 70–99)
Potassium: 4.2 mEq/L (ref 3.5–5.1)
Potassium: 4.2 mEq/L (ref 3.5–5.1)
Sodium: 139 mEq/L (ref 135–145)
Sodium: 136 mEq/L (ref 135–145)
Total Bilirubin: 0.7 mg/dL (ref 0.3–1.2)
Total Bilirubin: 0.3 mg/dL (ref 0.3–1.2)
Total Protein: 7.1 g/dL (ref 6.0–8.3)
Total Protein: 6.6 g/dL (ref 6.0–8.3)

## 2011-03-01 LAB — DIFFERENTIAL
Basophils Absolute: 0 10*3/uL (ref 0.0–0.1)
Basophils Absolute: 0 10*3/uL (ref 0.0–0.1)
Basophils Relative: 0 % (ref 0–1)
Basophils Relative: 0 % (ref 0–1)
Eosinophils Absolute: 0.2 10*3/uL (ref 0.0–0.7)
Eosinophils Absolute: 0.3 10*3/uL (ref 0.0–0.7)
Eosinophils Relative: 3 % (ref 0–5)
Eosinophils Relative: 3 % (ref 0–5)
Lymphocytes Relative: 32 % (ref 12–46)
Lymphocytes Relative: 22 % (ref 12–46)
Lymphs Abs: 2.2 10*3/uL (ref 0.7–4.0)
Lymphs Abs: 1.9 10*3/uL (ref 0.7–4.0)
Monocytes Absolute: 0.5 10*3/uL (ref 0.1–1.0)
Monocytes Absolute: 0.6 10*3/uL (ref 0.1–1.0)
Monocytes Relative: 8 % (ref 3–12)
Monocytes Relative: 7 % (ref 3–12)
Neutro Abs: 4 10*3/uL (ref 1.7–7.7)
Neutro Abs: 5.8 10*3/uL (ref 1.7–7.7)
Neutrophils Relative %: 57 % (ref 43–77)
Neutrophils Relative %: 68 % (ref 43–77)

## 2011-03-01 LAB — URINE MICROSCOPIC-ADD ON

## 2011-03-01 LAB — CULTURE, BLOOD (ROUTINE X 2)
Culture: NO GROWTH
Culture: NO GROWTH
Report Status: 6212010
Report Status: 6212010

## 2011-03-01 LAB — RAPID URINE DRUG SCREEN, HOSP PERFORMED
Amphetamines: NOT DETECTED
Barbiturates: NOT DETECTED
Benzodiazepines: POSITIVE — AB
Cocaine: NOT DETECTED
Opiates: POSITIVE — AB
Tetrahydrocannabinol: NOT DETECTED

## 2011-03-01 LAB — VITAMIN B12

## 2011-03-01 LAB — TYPE AND SCREEN
ABO/RH(D): AB POS
Antibody Screen: NEGATIVE

## 2011-03-01 LAB — POCT CARDIAC MARKERS
CKMB, poc: 1.2 ng/mL (ref 1.0–8.0)
Myoglobin, poc: 87.6 ng/mL (ref 12–200)
Troponin i, poc: <0.05 ng/mL (ref 0.00–0.09)

## 2011-03-01 LAB — CLOSTRIDIUM DIFFICILE EIA: C difficile Toxins A+B, EIA: NEGATIVE

## 2011-03-01 LAB — SURGICAL PCR SCREEN
MRSA, PCR: NEGATIVE
Staphylococcus aureus: NEGATIVE

## 2011-03-01 LAB — FOLATE: Folate: 16 ng/mL

## 2011-03-01 LAB — AMMONIA: Ammonia: 20 umol/L (ref 11–35)

## 2011-03-03 ENCOUNTER — Ambulatory Visit (HOSPITAL_COMMUNITY)
Admission: RE | Admit: 2011-03-03 | Discharge: 2011-03-04 | Disposition: A | Discharge status: Home / Self Care | Payer: BC Managed Care – PPO | Source: Ambulatory Visit | Attending: Obstetrics & Gynecology | Admitting: Obstetrics & Gynecology

## 2011-03-03 ENCOUNTER — Other Ambulatory Visit: Payer: Self-pay | Admitting: Obstetrics & Gynecology

## 2011-03-03 DIAGNOSIS — I10 Essential (primary) hypertension: Secondary | ICD-10-CM | POA: Insufficient documentation

## 2011-03-03 DIAGNOSIS — E119 Type 2 diabetes mellitus without complications: Secondary | ICD-10-CM | POA: Insufficient documentation

## 2011-03-03 DIAGNOSIS — Z79899 Other long term (current) drug therapy: Secondary | ICD-10-CM | POA: Insufficient documentation

## 2011-03-03 DIAGNOSIS — D279 Benign neoplasm of unspecified ovary: Secondary | ICD-10-CM | POA: Insufficient documentation

## 2011-03-03 LAB — GLUCOSE, CAPILLARY
Glucose-Capillary: 150 mg/dL — ABNORMAL HIGH (ref 70–99)
Glucose-Capillary: 140 mg/dL — ABNORMAL HIGH (ref 70–99)
Glucose-Capillary: 170 mg/dL — ABNORMAL HIGH (ref 70–99)
Glucose-Capillary: 157 mg/dL — ABNORMAL HIGH (ref 70–99)

## 2011-03-04 LAB — DIFFERENTIAL
Basophils Absolute: 0 10*3/uL (ref 0.0–0.1)
Basophils Relative: 0 % (ref 0–1)
Eosinophils Absolute: 0.1 10*3/uL (ref 0.0–0.7)
Eosinophils Relative: 2 % (ref 0–5)
Lymphocytes Relative: 38 % (ref 12–46)
Lymphs Abs: 2.6 10*3/uL (ref 0.7–4.0)
Monocytes Absolute: 0.4 10*3/uL (ref 0.1–1.0)
Monocytes Relative: 6 % (ref 3–12)
Neutro Abs: 3.7 10*3/uL (ref 1.7–7.7)
Neutrophils Relative %: 54 % (ref 43–77)

## 2011-03-04 LAB — GLUCOSE, CAPILLARY
Glucose-Capillary: 167 mg/dL — ABNORMAL HIGH (ref 70–99)
Glucose-Capillary: 160 mg/dL — ABNORMAL HIGH (ref 70–99)
Glucose-Capillary: 188 mg/dL — ABNORMAL HIGH (ref 70–99)

## 2011-03-04 LAB — CBC
HCT: 29.4 % — ABNORMAL LOW (ref 36.0–46.0)
Hemoglobin: 9.5 g/dL — ABNORMAL LOW (ref 12.0–15.0)
MCH: 27 pg (ref 26.0–34.0)
MCHC: 32.3 g/dL (ref 30.0–36.0)
MCV: 83.5 fL (ref 78.0–100.0)
Platelets: 213 10*3/uL (ref 150–400)
RBC: 3.52 MIL/uL — ABNORMAL LOW (ref 3.87–5.11)
RDW: 15.4 % (ref 11.5–15.5)
WBC: 6.8 10*3/uL (ref 4.0–10.5)

## 2011-03-08 NOTE — Op Note (Signed)
[name], [name]              ACCOUNT NO.:  [id number]  MEDICAL RECORD NO.:  [id number]           PATIENT TYPE:  I  LOCATION:  A320                          FACILITY:  [facility]  PHYSICIAN:  Lazaro Arms, M.D.   DATE OF BIRTH:  July 04, 1951  DATE OF PROCEDURE:  03/03/2011 DATE OF DISCHARGE:                              OPERATIVE REPORT   PREOPERATIVE DIAGNOSES: 1. A 7-cm right ovarian mass, some characteristics of complex 2. Normal preoperative CA-125.  POSTOPERATIVE DIAGNOSES: 1. A 7-cm right ovarian mass, some characteristics of complex 2. Normal preoperative CA-125. 3. One small tiny excrescence on the outside of the right ovary. 4. The rest of the peritoneal surfaces, ovary, uterus were all normal.     There was no studding.  There was no other evidence of any possible     peritoneal malignancy or disease.  PROCEDURE:  Laparoscopic supracervical hysterectomy with bilateral salpingo-oophorectomy.  SURGEON:  Lazaro Arms, MDANESTHESIA:  General.  FINDINGS:  Preoperatively, the patient was known to have a 7.1 cm right ovarian cyst.  I reviewed the films by myself and it was appeared to be simple, but it had just 1 or 2 very small features of a possible complexity.  There was a little bit of internal echo and maybe some internal septation, but nothing definitive, it certainly looked overall very benign, as the preoperative CA-125 was also normal at 29, as a result with the rest of her scans being normal, I decided to do laparoscopic supracervical hysterectomy with bilateral salpingo- oophorectomy.  At the time of surgery, she had of course the right ovary was enlarged, the left ovary was normal, the uterus, normal.  There was no disease in the perineum and anywhere no studding.  The omentum was normal.  There was no other disease in the pelvis.  She did have one small tiny excrescence on the outside of the right ovary, so I made sure that I took the ovary out intact.  As  a result, there was no intraperitoneal spillage of any ovarian contents when I was removing it I had an open fascia with a hand and I did have some loss of fluid outside the patient's body, but the ovaries were removed from the body intact all the fluids was on the outside.  DESCRIPTION OF OPERATION:  The patient was taken to the operating room, placed in the supine position where she underwent general endotracheal anesthesia.  She was then placed in lithotomy position, prepped and draped in usual sterile fashion.  An incision was made in the umbilicus and a Veress needle was used.  Peritoneal cavity was insufflated.  A non- bladed trocar was then used and the video laparoscope was employed and placed in the peritoneal cavity under direct visualization without difficulty.  Incisions were then made in the right and left lower quadrants under direct visualization.  The trocars were then placed under direct visualization of the right and left lower quadrants. Attention was turned to the right ovary which of course was enlarged and I inspected the surface and there was one area of the ovary that was concerned maybe  an excrescence.  Again, all the rest peritoneal surfaces of the left ovaries were atrophic, everything else appeared to be normal, but I wanted to be safe, so I removed the ovary with the Harmonic scalpel separately, then I did the laparoscopic supracervical hysterectomy.  I took down the round ligament and the utero-ovarian ligament and upgraded the vesicouterine serosal flap which was easy that the patient had 2 C-sections.  I then ligated the right uterine vessels. The left round ligament was then ligated with Harmonic scalpel and the utero-ovarian ligament as well.  I then created a vesicouterine serosal flap of the left as well.  Harmonic scalpel was used and the uterine vessels were transected, I removed the uterus just below the level of the uterine artery at the internal  os with the Harmonic scalpel and then took the left ovary down with Harmonic scalpel as well without difficulty.  At the end, enlarged my incision in the left lower quadrant and under direct visualization, I manually removed the right ovary as I was taken it out I grabbed it with a tooth clamp and at the outside the body I did have some spillage of the cystic component, however, there was none that was lost inside the patient.  I did irrigate vigorously to ensure were no spillage at the peritoneal cavity.  In case they turn out to be malignant. I then just removed the uterus of the left ovary manually as well the left lower quadrant incision.  Fascial incision was closed with 0 Vicryl running as well as umbilical incision and the right lower quadrant incision, subcu sutures were then placed and skin incision was closed with staples.  The patient tolerated the procedure well.  She experienced minimal blood loss, taken to the recovery in good stable condition.  All counts correct.  She received Ancef prophylactically.     Lazaro Arms, M.D.     Loraine Maple  D:  03/03/2011  T:  03/04/2011  Job:  [id number]  Electronically Signed by Duane Lope M.D. on 03/08/2011 10:05:16 AM

## 2011-03-12 ENCOUNTER — Emergency Department (HOSPITAL_COMMUNITY)
Admission: EM | Admit: 2011-03-12 | Discharge: 2011-03-12 | Disposition: A | Discharge status: Home / Self Care | Payer: BC Managed Care – PPO | Attending: Emergency Medicine | Admitting: Emergency Medicine

## 2011-03-12 ENCOUNTER — Emergency Department (HOSPITAL_COMMUNITY): Payer: BC Managed Care – PPO

## 2011-03-12 DIAGNOSIS — F411 Generalized anxiety disorder: Secondary | ICD-10-CM | POA: Insufficient documentation

## 2011-03-12 DIAGNOSIS — I10 Essential (primary) hypertension: Secondary | ICD-10-CM | POA: Insufficient documentation

## 2011-03-12 DIAGNOSIS — G8929 Other chronic pain: Secondary | ICD-10-CM | POA: Insufficient documentation

## 2011-03-12 DIAGNOSIS — K219 Gastro-esophageal reflux disease without esophagitis: Secondary | ICD-10-CM | POA: Insufficient documentation

## 2011-03-12 DIAGNOSIS — E785 Hyperlipidemia, unspecified: Secondary | ICD-10-CM | POA: Insufficient documentation

## 2011-03-12 DIAGNOSIS — Y92410 Unspecified street and highway as the place of occurrence of the external cause: Secondary | ICD-10-CM | POA: Insufficient documentation

## 2011-03-12 DIAGNOSIS — E119 Type 2 diabetes mellitus without complications: Secondary | ICD-10-CM | POA: Insufficient documentation

## 2011-03-12 DIAGNOSIS — R404 Transient alteration of awareness: Secondary | ICD-10-CM | POA: Insufficient documentation

## 2011-03-12 DIAGNOSIS — R51 Headache: Secondary | ICD-10-CM | POA: Insufficient documentation

## 2011-03-12 DIAGNOSIS — S0990XA Unspecified injury of head, initial encounter: Secondary | ICD-10-CM | POA: Insufficient documentation

## 2011-03-12 DIAGNOSIS — F3289 Other specified depressive episodes: Secondary | ICD-10-CM | POA: Insufficient documentation

## 2011-03-12 DIAGNOSIS — Y998 Other external cause status: Secondary | ICD-10-CM | POA: Insufficient documentation

## 2011-03-12 DIAGNOSIS — M549 Dorsalgia, unspecified: Secondary | ICD-10-CM | POA: Insufficient documentation

## 2011-03-12 DIAGNOSIS — D649 Anemia, unspecified: Secondary | ICD-10-CM | POA: Insufficient documentation

## 2011-03-12 DIAGNOSIS — Z79899 Other long term (current) drug therapy: Secondary | ICD-10-CM | POA: Insufficient documentation

## 2011-03-12 DIAGNOSIS — F1910 Other psychoactive substance abuse, uncomplicated: Secondary | ICD-10-CM | POA: Insufficient documentation

## 2011-03-12 DIAGNOSIS — F329 Major depressive disorder, single episode, unspecified: Secondary | ICD-10-CM | POA: Insufficient documentation

## 2011-03-12 LAB — BASIC METABOLIC PANEL
BUN: 17 mg/dL (ref 6–23)
CO2: 24 mEq/L (ref 19–32)
Calcium: 8.9 mg/dL (ref 8.4–10.5)
Chloride: 105 mEq/L (ref 96–112)
Creatinine, Ser: 0.92 mg/dL (ref 0.4–1.2)
GFR calc Af Amer: >60 mL/min (ref >60)
GFR calc non Af Amer: >60 mL/min (ref >60)
Glucose, Bld: 132 mg/dL — ABNORMAL HIGH (ref 70–99)
Potassium: 3.3 mEq/L — ABNORMAL LOW (ref 3.5–5.1)
Sodium: 138 mEq/L (ref 135–145)

## 2011-03-12 LAB — RAPID URINE DRUG SCREEN, HOSP PERFORMED
Amphetamines: NOT DETECTED
Barbiturates: NOT DETECTED
Benzodiazepines: POSITIVE — AB
Cocaine: NOT DETECTED
Opiates: POSITIVE — AB
Tetrahydrocannabinol: NOT DETECTED

## 2011-03-12 LAB — CBC
HCT: 29.8 % — ABNORMAL LOW (ref 36.0–46.0)
Hemoglobin: 9.7 g/dL — ABNORMAL LOW (ref 12.0–15.0)
MCH: 26.8 pg (ref 26.0–34.0)
MCHC: 32.6 g/dL (ref 30.0–36.0)
MCV: 82.3 fL (ref 78.0–100.0)
Platelets: 295 10*3/uL (ref 150–400)
RBC: 3.62 MIL/uL — ABNORMAL LOW (ref 3.87–5.11)
RDW: 14.9 % (ref 11.5–15.5)
WBC: 8.1 10*3/uL (ref 4.0–10.5)

## 2011-03-12 LAB — DIFFERENTIAL
Basophils Absolute: 0 10*3/uL (ref 0.0–0.1)
Basophils Relative: 0 % (ref 0–1)
Eosinophils Absolute: 0.2 10*3/uL (ref 0.0–0.7)
Eosinophils Relative: 2 % (ref 0–5)
Lymphocytes Relative: 30 % (ref 12–46)
Lymphs Abs: 2.4 10*3/uL (ref 0.7–4.0)
Monocytes Absolute: 0.6 10*3/uL (ref 0.1–1.0)
Monocytes Relative: 7 % (ref 3–12)
Neutro Abs: 4.9 10*3/uL (ref 1.7–7.7)
Neutrophils Relative %: 60 % (ref 43–77)

## 2011-03-12 LAB — ETHANOL: Alcohol, Ethyl (B): <5 mg/dL (ref 0–10)

## 2011-03-13 LAB — DIFFERENTIAL
Basophils Absolute: 0 10*3/uL (ref 0.0–0.1)
Basophils Relative: 0 % (ref 0–1)
Eosinophils Absolute: 0.2 10*3/uL (ref 0.0–0.7)
Eosinophils Relative: 3 % (ref 0–5)
Lymphocytes Relative: 32 % (ref 12–46)
Lymphs Abs: 2.6 10*3/uL (ref 0.7–4.0)
Monocytes Absolute: 0.5 10*3/uL (ref 0.1–1.0)
Monocytes Relative: 6 % (ref 3–12)
Neutro Abs: 4.8 10*3/uL (ref 1.7–7.7)
Neutrophils Relative %: 59 % (ref 43–77)

## 2011-03-13 LAB — COMPREHENSIVE METABOLIC PANEL
ALT: 14 U/L (ref 0–35)
AST: 14 U/L (ref 0–37)
Albumin: 3.8 g/dL (ref 3.5–5.2)
Alkaline Phosphatase: 78 U/L (ref 39–117)
BUN: 17 mg/dL (ref 6–23)
CO2: 25 mEq/L (ref 19–32)
Calcium: 9.2 mg/dL (ref 8.4–10.5)
Chloride: 106 mEq/L (ref 96–112)
Creatinine, Ser: 0.75 mg/dL (ref 0.4–1.2)
GFR calc Af Amer: >60 mL/min (ref >60)
GFR calc non Af Amer: >60 mL/min (ref >60)
Glucose, Bld: 143 mg/dL — ABNORMAL HIGH (ref 70–99)
Potassium: 3.3 mEq/L — ABNORMAL LOW (ref 3.5–5.1)
Sodium: 140 mEq/L (ref 135–145)
Total Bilirubin: 0.2 mg/dL — ABNORMAL LOW (ref 0.3–1.2)
Total Protein: 7.2 g/dL (ref 6.0–8.3)

## 2011-03-13 LAB — CBC
HCT: 31.7 % — ABNORMAL LOW (ref 36.0–46.0)
Hemoglobin: 10.5 g/dL — ABNORMAL LOW (ref 12.0–15.0)
MCH: 27.3 pg (ref 26.0–34.0)
MCHC: 33.1 g/dL (ref 30.0–36.0)
MCV: 82.6 fL (ref 78.0–100.0)
Platelets: 310 10*3/uL (ref 150–400)
RBC: 3.84 MIL/uL — ABNORMAL LOW (ref 3.87–5.11)
RDW: 15.1 % (ref 11.5–15.5)
WBC: 8.1 10*3/uL (ref 4.0–10.5)

## 2011-03-13 LAB — RAPID URINE DRUG SCREEN, HOSP PERFORMED
Amphetamines: NOT DETECTED
Barbiturates: NOT DETECTED
Benzodiazepines: POSITIVE — AB
Cocaine: NOT DETECTED
Opiates: POSITIVE — AB
Tetrahydrocannabinol: NOT DETECTED

## 2011-03-13 LAB — ETHANOL: Alcohol, Ethyl (B): <5 mg/dL (ref 0–10)

## 2011-04-06 NOTE — H&P (Signed)
[name], [name]              ACCOUNT NO.:  [id number]   MEDICAL RECORD NO.:  [id number]          PATIENT TYPE:  INP   LOCATION:  A340                          FACILITY:  [facility]   PHYSICIAN:  Edward L. Juanetta Gosling, M.D.DATE OF BIRTH:  [birth year]   DATE OF ADMISSION:  05/06/2009  DATE OF DISCHARGE:  LH                              HISTORY & PHYSICAL   Patient of Dr. Renard Matter.   REASON FOR ADMISSION:  Change in mental status, UTI, dizziness.   HISTORY:  [name] is a 60 year old with multiple medical problems  who was found to be dizzy.  Had a sense that she was about to pass out.  She had a low blood sugar in the 70s and her family was concerned that  in addition to that she was taking too much of her benzodiazepine  medications.  She says that she has been very anxious.   PAST MEDICAL HISTORY:  1. Positive for hypertension.  2. Diabetes.  3. Anxiety.  4. Hyperlipidemia.   FAMILY HISTORY:  Positive for asthma.  Her daughter has had an  autoimmune hepatitis.   SURGICAL HISTORY:  She has had a C-section, tubal ligation and some sort  of surgery on her right ankle.   SOCIAL HISTORY:  She does not use any alcohol or tobacco or any illicit  drugs.   MEDICATIONS AT HOME:  1. Benicar HCT 20/12.5 daily.  2. Tricor 145 mg daily.  3. Zetia 10 mg daily.  4. Metformin 1000 mg b.i.d.   She is allergic to SULFA.   REVIEW OF SYSTEMS:  Except as mentioned, is negative.  She has not had  any fever or chills.   PHYSICAL EXAMINATION:  She does appear to be mildly confused.  Her pupils equal, round and reactive to light and accommodation.  Nose  and throat are clear.  NECK:  Supple without masses.  CHEST:  Clear.  HEART:  Regular.  ABDOMEN:  Soft.  EXTREMITIES:  Showed no edema.  CENTRAL NERVOUS SYSTEM EXAMINATION:  She is sluggish and slow to react.   Her lab work shows white count is 8600, hemoglobin 9.3, platelets 214.  CT of the brain showed no acute intracranial abnormality.   Cardiac panel  normal.  Electrolytes are normal.  Her urine is very positive.  Urine  drug screen showed opiates and benzodiazepines.   ASSESSMENT:  She has probable urinary tract infection.  She perhaps has  some overuse of medications.   PLAN:  Admit her for IV medication.  She is going to receive IV fluids.  We will reevaluate tomorrow.  She is also anemic, so that will need to  be evaluated.  Continue with all the other treatments and follow.  Dr.  Renard Matter will assume her care in the morning.      Edward L. Juanetta Gosling, M.D.  Electronically Signed     ELH/MEDQ  D:  05/07/2009  T:  05/07/2009  Job:  [id number]

## 2011-04-06 NOTE — Group Therapy Note (Signed)
[name], [name]              ACCOUNT NO.:  [id number]   MEDICAL RECORD NO.:  [id number]          PATIENT TYPE:  INP   LOCATION:  A340                          FACILITY:  [facility]   PHYSICIAN:  Angus G. Renard Matter, MD   DATE OF BIRTH:  05-Apr-1951   DATE OF PROCEDURE:  DATE OF DISCHARGE:                                 PROGRESS NOTE   SUBJECTIVE:  This patient was admitted with dizziness and low blood  sugar.  She is much more alert.  She is thought to have urinary tract  infection as well.   OBJECTIVE:  VITAL SIGNS:  Blood pressure 153/80, respirations 20, pulse  95, and temperature 98.8.  LUNGS:  Clear to P and A.  HEART:  Regular rhythm.  ABDOMEN:  No palpable organs or masses.   Hemoglobin 9.2, hematocrit 26.7, and glucose 128.  Urine showed  increased number of bacteria and cells.   IMPRESSION:  Urinary tract infection, hypoglycemia, possible over use of  medication.      Angus G. Renard Matter, MD  Electronically Signed     AGM/MEDQ  D:  05/07/2009  T:  05/08/2009  Job:  [id number]

## 2011-04-09 NOTE — Discharge Summary (Signed)
[name], [name]              ACCOUNT NO.:  [id number]   MEDICAL RECORD NO.:  [id number]          PATIENT TYPE:  INP   LOCATION:  A340                          FACILITY:  [facility]   PHYSICIAN:  Angus G. Renard Matter, MD   DATE OF BIRTH:  [birth year]   DATE OF ADMISSION:  05/06/2009  DATE OF DISCHARGE:  06/17/2010LH                               DISCHARGE SUMMARY   DIAGNOSES:  1. Urinary tract infection.  2. Hypoglycemia.  3. Possible overuse of medication with altered mental status.   CONDITION:  Stable and improved at the time of discharge.   A 60 year old patient with multiple medical problems, found to be dizzy.  Thought she was going to pass out.  She had a low blood sugar in the  70s.  Her family was concerned that she might be taking too much of her  benzodiazepine medications.  She is very anxious.   PHYSICAL EXAMINATION ON ADMISSION:  GENERAL:  Mildly confused female.  HEENT:  Negative.  LUNGS:  Clear to P and A.  HEART:  Regular rhythm.  ABDOMEN:  No palpable organs or masses.  EXTREMITIES:  Free of edema.   The patient admitted for IV fluids.   LABORATORY DATA:  White count 8600 with a hemoglobin 9.3, platelets 214.  X-rays on admission, CT of the brain showed no acute intracranial  abnormality.  Cardiac panel was normal.  Electrolytes were normal.   HOSPITAL COURSE:  The patient, at the time of admission, was placed on  intravenous fluids, normal saline 100 mL/hour, 1800-calorie ADA diet.  She is given Tylenol 650 mg p.r.n.  Accu-Cheks were monitored a.c. and  nightly.  She was felt on admission to have urinary tract infection as  well.  The patient tolerated medications adequately after 2-day  hospitalization and felt she could be discharged to be followed as an  outpatient.  She had developed some diarrhea which was treated with  Imodium.   The patient's medication list at the time of discharge:  1. Benicar 20/12.5 one daily.  2. Zetia 10 mg daily.  3. Lexapro  10 mg daily.  4. TriCor 145 mg daily.  5. Metformin 500 mg 2 in the morning and 1 in the evening.  6. Valium 5 mg at bedtime.  7. Lorazepam 0.5 mg daily.  8. Cipro 500 mg b.i.d.  9. Imodium 1 after each loose stool.   The patient was stable and improved at the time of discharge.      Angus G. Renard Matter, MD  Electronically Signed     AGM/MEDQ  D:  05/28/2009  T:  05/28/2009  Job:  [id number]

## 2011-05-26 ENCOUNTER — Emergency Department (HOSPITAL_COMMUNITY)
Admission: EM | Admit: 2011-05-26 | Discharge: 2011-05-26 | Disposition: A | Discharge status: Home / Self Care | Payer: BC Managed Care – PPO | Attending: Emergency Medicine | Admitting: Emergency Medicine

## 2011-05-26 DIAGNOSIS — G8929 Other chronic pain: Secondary | ICD-10-CM | POA: Insufficient documentation

## 2011-05-26 DIAGNOSIS — F411 Generalized anxiety disorder: Secondary | ICD-10-CM | POA: Insufficient documentation

## 2011-05-26 DIAGNOSIS — I10 Essential (primary) hypertension: Secondary | ICD-10-CM | POA: Insufficient documentation

## 2011-05-26 DIAGNOSIS — E119 Type 2 diabetes mellitus without complications: Secondary | ICD-10-CM | POA: Insufficient documentation

## 2011-05-26 DIAGNOSIS — E785 Hyperlipidemia, unspecified: Secondary | ICD-10-CM | POA: Insufficient documentation

## 2011-05-26 DIAGNOSIS — M549 Dorsalgia, unspecified: Secondary | ICD-10-CM | POA: Insufficient documentation

## 2011-05-26 DIAGNOSIS — Z79899 Other long term (current) drug therapy: Secondary | ICD-10-CM | POA: Insufficient documentation

## 2011-05-26 DIAGNOSIS — D649 Anemia, unspecified: Secondary | ICD-10-CM | POA: Insufficient documentation

## 2011-05-26 DIAGNOSIS — F329 Major depressive disorder, single episode, unspecified: Secondary | ICD-10-CM | POA: Insufficient documentation

## 2011-05-26 DIAGNOSIS — K219 Gastro-esophageal reflux disease without esophagitis: Secondary | ICD-10-CM | POA: Insufficient documentation

## 2011-05-26 DIAGNOSIS — F3289 Other specified depressive episodes: Secondary | ICD-10-CM | POA: Insufficient documentation

## 2011-05-26 LAB — RAPID URINE DRUG SCREEN, HOSP PERFORMED
Amphetamines: NOT DETECTED
Barbiturates: NOT DETECTED
Benzodiazepines: POSITIVE — AB
Cocaine: NOT DETECTED
Opiates: NOT DETECTED
Tetrahydrocannabinol: NOT DETECTED

## 2011-05-26 LAB — COMPREHENSIVE METABOLIC PANEL
ALT: 10 U/L (ref 0–35)
AST: 13 U/L (ref 0–37)
Albumin: 3.3 g/dL — ABNORMAL LOW (ref 3.5–5.2)
Alkaline Phosphatase: 88 U/L (ref 39–117)
BUN: 23 mg/dL (ref 6–23)
CO2: 31 mEq/L (ref 19–32)
Calcium: 9 mg/dL (ref 8.4–10.5)
Chloride: 101 mEq/L (ref 96–112)
Creatinine, Ser: 0.98 mg/dL (ref 0.50–1.10)
GFR calc Af Amer: >60 mL/min (ref >60)
GFR calc non Af Amer: 58 mL/min — ABNORMAL LOW (ref >60)
Glucose, Bld: 166 mg/dL — ABNORMAL HIGH (ref 70–99)
Potassium: 4.3 mEq/L (ref 3.5–5.1)
Sodium: 138 mEq/L (ref 135–145)
Total Bilirubin: 0.4 mg/dL (ref 0.3–1.2)
Total Protein: 6.9 g/dL (ref 6.0–8.3)

## 2011-05-26 LAB — DIFFERENTIAL
Basophils Absolute: 0 10*3/uL (ref 0.0–0.1)
Basophils Relative: 0 % (ref 0–1)
Eosinophils Absolute: 0.1 10*3/uL (ref 0.0–0.7)
Eosinophils Relative: 2 % (ref 0–5)
Lymphocytes Relative: 39 % (ref 12–46)
Lymphs Abs: 2.1 10*3/uL (ref 0.7–4.0)
Monocytes Absolute: 0.5 10*3/uL (ref 0.1–1.0)
Monocytes Relative: 9 % (ref 3–12)
Neutro Abs: 2.7 10*3/uL (ref 1.7–7.7)
Neutrophils Relative %: 51 % (ref 43–77)

## 2011-05-26 LAB — CBC
HCT: 27.8 % — ABNORMAL LOW (ref 36.0–46.0)
Hemoglobin: 9.1 g/dL — ABNORMAL LOW (ref 12.0–15.0)
MCH: 27.9 pg (ref 26.0–34.0)
MCHC: 32.7 g/dL (ref 30.0–36.0)
MCV: 85.3 fL (ref 78.0–100.0)
Platelets: 244 10*3/uL (ref 150–400)
RBC: 3.26 MIL/uL — ABNORMAL LOW (ref 3.87–5.11)
RDW: 13.8 % (ref 11.5–15.5)
WBC: 5.4 10*3/uL (ref 4.0–10.5)

## 2011-05-26 LAB — SALICYLATE LEVEL: Salicylate Lvl: <2 mg/dL — ABNORMAL LOW (ref 2.8–20.0)

## 2011-05-26 LAB — ACETAMINOPHEN LEVEL: Acetaminophen (Tylenol), Serum: <15 ug/mL (ref 10–30)

## 2011-05-26 LAB — ETHANOL: Alcohol, Ethyl (B): <11 mg/dL (ref 0–11)

## 2012-03-24 ENCOUNTER — Emergency Department (HOSPITAL_COMMUNITY)
Admission: EM | Admit: 2012-03-24 | Discharge: 2012-03-24 | Disposition: A | Discharge status: Home / Self Care | Payer: Self-pay | Attending: Emergency Medicine | Admitting: Emergency Medicine

## 2012-03-24 ENCOUNTER — Encounter (HOSPITAL_COMMUNITY): Payer: Self-pay | Admitting: *Deleted

## 2012-03-24 DIAGNOSIS — Z79899 Other long term (current) drug therapy: Secondary | ICD-10-CM | POA: Insufficient documentation

## 2012-03-24 DIAGNOSIS — L02219 Cutaneous abscess of trunk, unspecified: Secondary | ICD-10-CM | POA: Insufficient documentation

## 2012-03-24 DIAGNOSIS — I10 Essential (primary) hypertension: Secondary | ICD-10-CM | POA: Insufficient documentation

## 2012-03-24 DIAGNOSIS — L03319 Cellulitis of trunk, unspecified: Secondary | ICD-10-CM | POA: Insufficient documentation

## 2012-03-24 DIAGNOSIS — L02213 Cutaneous abscess of chest wall: Secondary | ICD-10-CM

## 2012-03-24 DIAGNOSIS — E119 Type 2 diabetes mellitus without complications: Secondary | ICD-10-CM | POA: Insufficient documentation

## 2012-03-24 HISTORY — DX: Essential (primary) hypertension: I10

## 2012-03-24 MED ORDER — DOXYCYCLINE HYCLATE 100 MG PO CAPS: 100.0000 mg | ORAL_CAPSULE | Freq: Two times a day (BID) | ORAL | Status: AC

## 2012-03-24 MED ORDER — HYDROCODONE-ACETAMINOPHEN 5-325 MG PO TABS
1.0000 | ORAL_TABLET | Freq: Once | ORAL | Status: AC
  Administered 2012-03-24: 1 via ORAL
  Filled 2012-03-24: qty 1

## 2012-03-24 MED ORDER — DOXYCYCLINE HYCLATE 100 MG PO TABS
100.0000 mg | ORAL_TABLET | Freq: Once | ORAL | Status: AC
  Administered 2012-03-24: 100 mg via ORAL
  Filled 2012-03-24: qty 1

## 2012-03-24 MED ORDER — HYDROCODONE-ACETAMINOPHEN 5-325 MG PO TABS: 1.0000 | ORAL_TABLET | Freq: Four times a day (QID) | ORAL | Status: AC | PRN

## 2012-03-24 NOTE — ED Notes (Signed)
Pt presents with large abscess on posterior left rib cage. Area has noted swelling and redness with center of abscess prominent. No drainage noted at this time. Pt denies fever and previous known MRSA infection

## 2012-03-24 NOTE — Discharge Instructions (Signed)
Abscess An abscess (boil or furuncle) is an infected area under your skin. This area is filled with yellowish white fluid (pus). HOME CARE   Only take medicine as told by your doctor.   Keep the skin clean around your abscess. Keep clothes that may touch the abscess clean.   Change any bandages (dressings) as told by your doctor.   Avoid direct skin contact with other people. The infection can spread by skin contact with others.   Practice good hygiene and do not share personal care items.   Do not share athletic equipment, towels, or whirlpools. Shower after every practice or work out session.   If a draining area cannot be covered:   Do not play sports.   Children should not go to daycare until the wound has healed or until fluid (drainage) stops coming out of the wound.   See your doctor for a follow-up visit as told.  GET HELP RIGHT AWAY IF:   There is more pain, puffiness (swelling), and redness in the wound site.   There is fluid or bleeding from the wound site.   You have muscle aches, chills, fever, or feel sick.   You or your child has a temperature by mouth above 102 F (38.9 C), not controlled by medicine.   Your baby is older than 3 months with a rectal temperature of 102 F (38.9 C) or higher.  MAKE SURE YOU:   Understand these instructions.   Will watch your condition.   Will get help right away if you are not doing well or get worse.  Document Released: 04/26/2008 Document Revised: 10/28/2011 Document Reviewed: 04/26/2008 [facility] Patient Information 2012 [place], [place].Heat Therapy Your caregiver advises heat therapy for your condition. Heat applications help reduce pain and muscle spasm around injuries or areas of inflammation. They also increase blood flow to the area which can speed healing. Moist heat is commonly used to help heal skin infections. Heat treatments should be used for about 30-40 minutes every 2-4 hours. Shorter treatments should be used  if there is discomfort. Different forms of heat therapy are:  Warm water - Use a basin or tub filled with heated water; change it often to keep the water hot. The water temperature should not be uncomfortable to the skin.   Hot packs - Use several bath towels soaked in hot water and lightly wrung out. These should be changed every 5-10 minutes. You can buy commercially-available packs that provide more sustained heat. Hot water bottles are not recommended because they give only a small amount of heat.   Electric heating pads - These may be used for dry heat only. Do not use wet material around a regular heating pad because of the risk of electrical shock. Do not leave heating pads on for long periods as they can burn the skin or cause permanent discoloration. Do not lie on top of a heating pad because, again, this can cause a burn.   Heat lamps - Use an infrared light. Keep the bulb 15-25 inches from the skin. Watch for signs of excessive heat (blotchy areas will appear).  Be cautious with heat therapy to avoid burning the skin. You should not use heat therapy without careful medical supervision if you have: circulation problems, numbness or unusual swelling in the area to be treated. Document Released: 11/08/2005 Document Revised: 10/28/2011 Document Reviewed: 05/06/2007 [facility] Patient Information 2012 [place], [place].   Take the meds as directed.  Apply warm compresses several times daily to  area.  Follow up with your MD.  Return to ED if your symptoms worsen or change significantly  In the meantime.

## 2012-03-24 NOTE — ED Provider Notes (Signed)
History     CSN: [id number]  Arrival date & time 03/24/12  1250   First MD Initiated Contact with Patient 03/24/12 1410      Chief Complaint  Patient presents with  . Abscess    (Consider location/radiation/quality/duration/timing/severity/associated sxs/prior treatment) HPI Comments: Sore area on L posterior/lateral chest wall.  No fever or chills.  ? minimal drainage.  + "soreness"/  No diff breathing.  The history is provided by the patient. No language interpreter was used.    Past Medical History  Diagnosis Date  . Diabetes mellitus   . Hypertension     Past Surgical History  Procedure Date  . Breast surgery   . C-sections   . Abdominal hysterectomy     No family history on file.  History  Substance Use Topics  . Smoking status: Never Smoker   . Smokeless tobacco: Not on file  . Alcohol Use: No    OB History    Grav Para Term Preterm Abortions TAB SAB Ect Mult Living                  Review of Systems  Constitutional: Negative for fever and chills.  Respiratory: Negative for shortness of breath.   Skin: Negative for wound.       Abscess   All other systems reviewed and are negative.    Allergies  Sulfonamide derivatives  Home Medications   Current Outpatient Rx  Name Route Sig Dispense Refill  . DOXYCYCLINE HYCLATE 100 MG PO CAPS Oral Take 1 capsule (100 mg total) by mouth 2 (two) times daily. 20 capsule 0  . DOXYCYCLINE HYCLATE 100 MG PO CAPS Oral Take 1 capsule (100 mg total) by mouth 2 (two) times daily. 20 capsule 0  . FERROUS SULFATE 325 (65 FE) MG PO TABS Oral Take 325 mg by mouth daily.    . OMEGA-3 FATTY ACIDS 1000 MG PO CAPS Oral Take 1 g by mouth daily.    Marland Kitchen HYDROCODONE-ACETAMINOPHEN 5-325 MG PO TABS Oral Take 1 tablet by mouth every 6 (six) hours as needed for pain. 20 tablet 0  . HYDROCODONE-ACETAMINOPHEN 5-325 MG PO TABS Oral Take 1 tablet by mouth every 4 (four) hours as needed for pain. 20 tablet 0  . METFORMIN HCL 1000 MG PO  TABS Oral Take 1,000 mg by mouth 2 (two) times daily with a meal.    . ONDANSETRON HCL 4 MG PO TABS Oral Take 1 tablet (4 mg total) by mouth every 8 (eight) hours as needed for nausea. 12 tablet 0  . UNKNOWN TO PATIENT Oral Take 1 tablet by mouth daily. BP MEDICATION: NAME UNKNOWN    . UNKNOWN TO PATIENT Oral Take 1 capsule by mouth daily. BP MEDICATION: NAME UNKNOWN      BP 144/83  Pulse 102  Temp(Src) 98.3 F (36.8 C) (Oral)  Resp 20  Ht 4\' 11"  (1.499 m)  Wt 170 lb (77.111 kg)  BMI 34.34 kg/m2  Physical Exam  Nursing note and vitals reviewed. Constitutional: She is oriented to person, place, and time. She appears well-developed and well-nourished. No distress.  HENT:  Head: Normocephalic and atraumatic.  Eyes: EOM are normal.  Neck: Normal range of motion.  Cardiovascular: Normal rate, regular rhythm and normal heart sounds.   Pulmonary/Chest: Effort normal and breath sounds normal. She has no decreased breath sounds.    Abdominal: Soft. She exhibits no distension. There is no tenderness.  Musculoskeletal: Normal range of motion.  Neurological: She is alert  and oriented to person, place, and time.  Skin: Skin is warm and dry.  Psychiatric: She has a normal mood and affect. Judgment normal.    ED Course  Procedures (including critical care time)  Labs Reviewed - No data to display No results found.   1. Abscess of chest wall       MDM  rx- doxycycline rx-hydrocodone Ibuprofen Warm compresses.        Worthy Rancher, PA 03/24/12 1447  Worthy Rancher, PA 04/01/12 [phone]

## 2012-03-24 NOTE — ED Notes (Signed)
Pt presents with large abscess on left posterior rib cage.

## 2012-03-29 ENCOUNTER — Encounter (HOSPITAL_COMMUNITY): Payer: Self-pay

## 2012-03-29 ENCOUNTER — Emergency Department (HOSPITAL_COMMUNITY)
Admission: EM | Admit: 2012-03-29 | Discharge: 2012-03-29 | Disposition: A | Discharge status: Home / Self Care | Payer: Self-pay | Attending: Emergency Medicine | Admitting: Emergency Medicine

## 2012-03-29 DIAGNOSIS — E119 Type 2 diabetes mellitus without complications: Secondary | ICD-10-CM | POA: Insufficient documentation

## 2012-03-29 DIAGNOSIS — L02212 Cutaneous abscess of back [any part, except buttock]: Secondary | ICD-10-CM

## 2012-03-29 DIAGNOSIS — Z79899 Other long term (current) drug therapy: Secondary | ICD-10-CM | POA: Insufficient documentation

## 2012-03-29 DIAGNOSIS — I10 Essential (primary) hypertension: Secondary | ICD-10-CM | POA: Insufficient documentation

## 2012-03-29 DIAGNOSIS — L02219 Cutaneous abscess of trunk, unspecified: Secondary | ICD-10-CM | POA: Insufficient documentation

## 2012-03-29 MED ORDER — DOXYCYCLINE HYCLATE 100 MG PO CAPS: 100.0000 mg | ORAL_CAPSULE | Freq: Two times a day (BID) | ORAL | Status: AC

## 2012-03-29 MED ORDER — LIDOCAINE HCL (PF) 1 % IJ SOLN
5.0000 mL | Freq: Once | INTRAMUSCULAR | Status: AC
  Administered 2012-03-29: 5 mL
  Filled 2012-03-29 (×3): qty 5

## 2012-03-29 MED ORDER — HYDROCODONE-ACETAMINOPHEN 5-325 MG PO TABS
1.0000 | ORAL_TABLET | ORAL | Status: AC | PRN
Start: 2012-03-29 — End: 2012-04-08

## 2012-03-29 NOTE — ED Notes (Signed)
Abcess noted to R back; area has been draining per pt after daughter "squeezed" area; drainage green-yellow; area red around drainage site; wound is not draining at this time

## 2012-03-29 NOTE — ED Notes (Signed)
MD at bedside. 

## 2012-03-29 NOTE — ED Notes (Signed)
Abscess on mid-left back; started about 2 weeks ago and I came over and they put me on an antibiotic, now it is draining per pt.

## 2012-03-29 NOTE — Discharge Instructions (Signed)
Abscess An abscess (boil or furuncle) is an infected area that contains a collection of pus.  SYMPTOMS Signs and symptoms of an abscess include pain, tenderness, redness, or hardness. You may feel a moveable soft area under your skin. An abscess can occur anywhere in the body.  TREATMENT  A surgical cut (incision) may be made over your abscess to drain the pus. Gauze may be packed into the space or a drain may be looped through the abscess cavity (pocket). This provides a drain that will allow the cavity to heal from the inside outwards. The abscess may be painful for a few days, but should feel much better if it was drained.  Your abscess, if seen early, may not have localized and may not have been drained. If not, another appointment may be required if it does not get better on its own or with medications. HOME CARE INSTRUCTIONS   Only take over-the-counter or prescription medicines for pain, discomfort, or fever as directed by your caregiver.   Take your antibiotics as directed if they were prescribed. Finish them even if you start to feel better.   Keep the skin and clothes clean around your abscess.   If the abscess was drained, you will need to use gauze dressing to collect any draining pus. Dressings will typically need to be changed 3 or more times a day.   The infection may spread by skin contact with others. Avoid skin contact as much as possible.   Practice good hygiene. This includes regular hand washing, cover any draining skin lesions, and do not share personal care items.   If you participate in sports, do not share athletic equipment, towels, whirlpools, or personal care items. Shower after every practice or tournament.   If a draining area cannot be adequately covered:   Do not participate in sports.   Children should not participate in day care until the wound has healed or drainage stops.   If your caregiver has given you a follow-up appointment, it is very important  to keep that appointment. Not keeping the appointment could result in a much worse infection, chronic or permanent injury, pain, and disability. If there is any problem keeping the appointment, you must call back to this facility for assistance.  SEEK MEDICAL CARE IF:   You develop increased pain, swelling, redness, drainage, or bleeding in the wound site.   You develop signs of generalized infection including muscle aches, chills, fever, or a general ill feeling.   You have an oral temperature above 102 F (38.9 C).  MAKE SURE YOU:   Understand these instructions.   Will watch your condition.   Will get help right away if you are not doing well or get worse.  Document Released: 08/18/2005 Document Revised: 10/28/2011 Document Reviewed: 06/11/2008 [facility] Patient Information 2012 [place], [place].    Return here in 2 days to have the packing removed.  Start taking doxycycline again and also used hydrocodone if needed for pain relief.  Do not drive within 4 hours of taking hydrocodone as this will make you drowsy.

## 2012-03-29 NOTE — ED Notes (Signed)
Pt alert & oriented x4, stable gait. Pt given discharge instructions, paperwork & prescription(s). Patient instructed to stop at the registration desk to finish any additional paperwork. pt verbalized understanding. Pt left department w/ no further questions.  

## 2012-03-30 NOTE — ED Provider Notes (Signed)
History     CSN: [id number]  Arrival date & time 03/29/12  2108   First MD Initiated Contact with Patient 03/29/12 2140      Chief Complaint  Patient presents with  . Abscess    (Consider location/radiation/quality/duration/timing/severity/associated sxs/prior treatment) HPI Comments: [name] presents for treatment of an abscess that has been present on her left midlateral back for the past 3 weeks.  She was seen here 5 days ago and treated with doxycycline and has been using warm compresses without improvement.  She did have some purulent drainage today after rubbing the area.  Pain is constant and throbbing and worse with palpation and movement of her left arm.  She denies fevers,  Chills,  No nausea or vomiting.  Her blood glucose levels have been stable and less than 200.  Patient is a 61 y.o. female presenting with abscess. The history is provided by the patient.  Abscess  Pertinent negatives include no fever, no congestion and no sore throat.    Past Medical History  Diagnosis Date  . Diabetes mellitus   . Hypertension     Past Surgical History  Procedure Date  . Breast surgery   . C-sections   . Abdominal hysterectomy     History reviewed. No pertinent family history.  History  Substance Use Topics  . Smoking status: Never Smoker   . Smokeless tobacco: Not on file  . Alcohol Use: No    OB History    Grav Para Term Preterm Abortions TAB SAB Ect Mult Living                  Review of Systems  Constitutional: Negative for fever.  HENT: Negative for congestion, sore throat and neck pain.   Eyes: Negative.   Respiratory: Negative for chest tightness and shortness of breath.   Cardiovascular: Negative for chest pain.  Gastrointestinal: Negative for nausea and abdominal pain.  Genitourinary: Negative.   Musculoskeletal: Negative for joint swelling and arthralgias.  Skin: Positive for wound. Negative for rash.  Neurological: Negative for dizziness,  weakness, light-headedness, numbness and headaches.  Hematological: Negative.   Psychiatric/Behavioral: Negative.     Allergies  Sulfonamide derivatives  Home Medications   Current Outpatient Rx  Name Route Sig Dispense Refill  . DOXYCYCLINE HYCLATE 100 MG PO CAPS Oral Take 1 capsule (100 mg total) by mouth 2 (two) times daily. 20 capsule 0  . FERROUS SULFATE 325 (65 FE) MG PO TABS Oral Take 325 mg by mouth daily.    . OMEGA-3 FATTY ACIDS 1000 MG PO CAPS Oral Take 1 g by mouth daily.    Marland Kitchen METFORMIN HCL 1000 MG PO TABS Oral Take 1,000 mg by mouth 2 (two) times daily with a meal.    . UNKNOWN TO PATIENT Oral Take 1 tablet by mouth daily. BP MEDICATION: NAME UNKNOWN    . UNKNOWN TO PATIENT Oral Take 1 capsule by mouth daily. BP MEDICATION: NAME UNKNOWN    . DOXYCYCLINE HYCLATE 100 MG PO CAPS Oral Take 1 capsule (100 mg total) by mouth 2 (two) times daily. 20 capsule 0  . HYDROCODONE-ACETAMINOPHEN 5-325 MG PO TABS Oral Take 1 tablet by mouth every 6 (six) hours as needed for pain. 20 tablet 0  . HYDROCODONE-ACETAMINOPHEN 5-325 MG PO TABS Oral Take 1 tablet by mouth every 4 (four) hours as needed for pain. 20 tablet 0    BP 153/111  Pulse 103  Temp(Src) 98 F (36.7 C) (Oral)  Resp  20  Ht 4\' 9"  (1.448 m)  Wt 171 lb (77.565 kg)  BMI 37.00 kg/m2  SpO2 100%  Physical Exam  Nursing note and vitals reviewed. Constitutional: She appears well-developed and well-nourished.  HENT:  Head: Normocephalic and atraumatic.  Eyes: Conjunctivae are normal.  Neck: Normal range of motion.  Cardiovascular: Normal rate, regular rhythm, normal heart sounds and intact distal pulses.   Pulmonary/Chest: Effort normal and breath sounds normal. She has no wheezes.  Abdominal: Soft. Bowel sounds are normal. There is no tenderness.  Musculoskeletal: Normal range of motion.  Neurological: She is alert.  Skin: Skin is warm and dry. There is erythema.          6 cm area of erythema with central raised and  fluctant area,  No spontaneous draining.  Psychiatric: She has a normal mood and affect.    ED Course  Procedures (including critical care time)   Labs Reviewed  WOUND CULTURE   No results found.   1. Abscess of back    INCISION AND DRAINAGE Performed by: Burgess Amor Consent: Verbal consent obtained. Risks and benefits: risks, benefits and alternatives were discussed Type: abscess  Body area: mid back  Anesthesia: local infiltration  Local anesthetic: lidocaine 1% without epinephrine  Anesthetic total:7 ml  Complexity: complex Blunt dissection to break up loculations  Drainage: purulent  Drainage amount: moderate  Packing material: 1/4 in iodoform gauze  Patient tolerance: Patient tolerated the procedure well with no immediate complications.      MDM  Patient to return in 2 days for packing removal.  Also prescribed additional course of doxycycline given surrounding persistent cellulitis.  Hydrocodone.        Burgess Amor, PA 03/30/12 1409

## 2012-03-30 NOTE — ED Provider Notes (Signed)
Medical screening examination/treatment/procedure(s) were performed by non-physician practitioner and as supervising physician I was immediately available for consultation/collaboration.   Shelda Jakes, MD 03/30/12 [phone]

## 2012-03-31 ENCOUNTER — Encounter (HOSPITAL_COMMUNITY): Payer: Self-pay | Admitting: *Deleted

## 2012-03-31 ENCOUNTER — Emergency Department (HOSPITAL_COMMUNITY)
Admission: EM | Admit: 2012-03-31 | Discharge: 2012-03-31 | Disposition: A | Discharge status: Home / Self Care | Payer: Self-pay | Attending: Emergency Medicine | Admitting: Emergency Medicine

## 2012-03-31 DIAGNOSIS — I10 Essential (primary) hypertension: Secondary | ICD-10-CM | POA: Insufficient documentation

## 2012-03-31 DIAGNOSIS — L03319 Cellulitis of trunk, unspecified: Secondary | ICD-10-CM | POA: Insufficient documentation

## 2012-03-31 DIAGNOSIS — L02219 Cutaneous abscess of trunk, unspecified: Secondary | ICD-10-CM | POA: Insufficient documentation

## 2012-03-31 DIAGNOSIS — Z09 Encounter for follow-up examination after completed treatment for conditions other than malignant neoplasm: Secondary | ICD-10-CM | POA: Insufficient documentation

## 2012-03-31 DIAGNOSIS — E119 Type 2 diabetes mellitus without complications: Secondary | ICD-10-CM | POA: Insufficient documentation

## 2012-03-31 DIAGNOSIS — L0291 Cutaneous abscess, unspecified: Secondary | ICD-10-CM

## 2012-03-31 LAB — GLUCOSE, CAPILLARY: Glucose-Capillary: 129 mg/dL — ABNORMAL HIGH (ref 70–99)

## 2012-03-31 MED ORDER — ONDANSETRON HCL 4 MG PO TABS: 4.0000 mg | ORAL_TABLET | Freq: Three times a day (TID) | ORAL | Status: AC | PRN

## 2012-03-31 NOTE — Discharge Instructions (Signed)
Abscess An abscess (boil or furuncle) is an infected area that contains a collection of pus.  SYMPTOMS Signs and symptoms of an abscess include pain, tenderness, redness, or hardness. You may feel a moveable soft area under your skin. An abscess can occur anywhere in the body.  TREATMENT  A surgical cut (incision) may be made over your abscess to drain the pus. Gauze may be packed into the space or a drain may be looped through the abscess cavity (pocket). This provides a drain that will allow the cavity to heal from the inside outwards. The abscess may be painful for a few days, but should feel much better if it was drained.  Your abscess, if seen early, may not have localized and may not have been drained. If not, another appointment may be required if it does not get better on its own or with medications. HOME CARE INSTRUCTIONS   Only take over-the-counter or prescription medicines for pain, discomfort, or fever as directed by your caregiver.   Take your antibiotics as directed if they were prescribed. Finish them even if you start to feel better.   Keep the skin and clothes clean around your abscess.   If the abscess was drained, you will need to use gauze dressing to collect any draining pus. Dressings will typically need to be changed 3 or more times a day.   The infection may spread by skin contact with others. Avoid skin contact as much as possible.   Practice good hygiene. This includes regular hand washing, cover any draining skin lesions, and do not share personal care items.   If you participate in sports, do not share athletic equipment, towels, whirlpools, or personal care items. Shower after every practice or tournament.   If a draining area cannot be adequately covered:   Do not participate in sports.   Children should not participate in day care until the wound has healed or drainage stops.   If your caregiver has given you a follow-up appointment, it is very important  to keep that appointment. Not keeping the appointment could result in a much worse infection, chronic or permanent injury, pain, and disability. If there is any problem keeping the appointment, you must call back to this facility for assistance.  SEEK MEDICAL CARE IF:   You develop increased pain, swelling, redness, drainage, or bleeding in the wound site.   You develop signs of generalized infection including muscle aches, chills, fever, or a general ill feeling.   You have an oral temperature above 102 F (38.9 C).  MAKE SURE YOU:   Understand these instructions.   Will watch your condition.   Will get help right away if you are not doing well or get worse.  Document Released: 08/18/2005 Document Revised: 10/28/2011 Document Reviewed: 06/11/2008 [facility] Patient Information 2012 [place], [place].   Doing warm water soaks today, 20 minutes at a time 3-4 times daily.  You may use the Zofran if you continue to have nausea.  I would encourage you to continue taking doxycycline for 3 more days, at that time if your wound is still doing as well as today you can stop taking this antibiotic.  As discussed please call Dr. Margo Aye for definitive treatment if this wound does not heal completely or if it comes back at any point, as I suspect this is originally an old sebaceous cyst which has become infected.  Therefore, it may need to be surgically excised if it continues to be a problem.

## 2012-03-31 NOTE — ED Notes (Signed)
For recheck of I and D of abscess on back, says the packing came out

## 2012-03-31 NOTE — ED Notes (Signed)
Dressing applied to wound. No drainage noted

## 2012-03-31 NOTE — ED Notes (Signed)
Pt returns to Ed for a wound recheck. Pt was here Wednesday evening per pt. Wound was I&D'd then packed at that time. Dressing removed, small amount of  Green/white drainage noted. Pt states was nausea and vomited today on the way here today.Pt states took her medicine before coming here and thinks it was the medicine. Denies n/v and fever. at this time.

## 2012-04-01 LAB — WOUND CULTURE
Culture: NO GROWTH
Gram Stain: NONE SEEN

## 2012-04-02 NOTE — ED Provider Notes (Signed)
Medical screening examination/treatment/procedure(s) were performed by non-physician practitioner and as supervising physician I was immediately available for consultation/collaboration.  Tomicka Lover, MD 04/02/12 1620 

## 2012-04-02 NOTE — ED Provider Notes (Signed)
Medical screening examination/treatment/procedure(s) were performed by non-physician practitioner and as supervising physician I was immediately available for consultation/collaboration.   Keniya Schlotterbeck W Coren Sagan, MD 04/02/12 1851 

## 2012-04-02 NOTE — ED Provider Notes (Signed)
History     CSN: [id number]  Arrival date & time 03/31/12  1603   First MD Initiated Contact with Patient 03/31/12 1605      Chief Complaint  Patient presents with  . Wound Check    (Consider location/radiation/quality/duration/timing/severity/associated sxs/prior treatment) Patient is a 61 y.o. female presenting with wound check. The history is provided by the patient.  Wound Check  She was treated in the ED 2 to 3 days ago. Previous treatment in the ED includes I&D of abscess and oral antibiotics. Treatments since wound repair include oral antibiotics. There has been no drainage from the wound. There is no redness present. The swelling has improved. The pain has improved.    Past Medical History  Diagnosis Date  . Diabetes mellitus   . Hypertension     Past Surgical History  Procedure Date  . Breast surgery   . C-sections   . Abdominal hysterectomy     History reviewed. No pertinent family history.  History  Substance Use Topics  . Smoking status: Never Smoker   . Smokeless tobacco: Not on file  . Alcohol Use: No    OB History    Grav Para Term Preterm Abortions TAB SAB Ect Mult Living                  Review of Systems  Constitutional: Negative for fever and chills.  Respiratory: Negative for shortness of breath.   Cardiovascular: Negative for chest pain.  Musculoskeletal: Negative for arthralgias.  Skin: Positive for wound.  Neurological: Positive for weakness and numbness.    Allergies  Sulfonamide derivatives  Home Medications   Current Outpatient Rx  Name Route Sig Dispense Refill  . DOXYCYCLINE HYCLATE 100 MG PO CAPS Oral Take 1 capsule (100 mg total) by mouth 2 (two) times daily. 20 capsule 0  . DOXYCYCLINE HYCLATE 100 MG PO CAPS Oral Take 1 capsule (100 mg total) by mouth 2 (two) times daily. 20 capsule 0  . FERROUS SULFATE 325 (65 FE) MG PO TABS Oral Take 325 mg by mouth daily.    . OMEGA-3 FATTY ACIDS 1000 MG PO CAPS Oral Take 1 g by  mouth daily.    Marland Kitchen HYDROCODONE-ACETAMINOPHEN 5-325 MG PO TABS Oral Take 1 tablet by mouth every 6 (six) hours as needed for pain. 20 tablet 0  . HYDROCODONE-ACETAMINOPHEN 5-325 MG PO TABS Oral Take 1 tablet by mouth every 4 (four) hours as needed for pain. 20 tablet 0  . METFORMIN HCL 1000 MG PO TABS Oral Take 1,000 mg by mouth 2 (two) times daily with a meal.    . ONDANSETRON HCL 4 MG PO TABS Oral Take 1 tablet (4 mg total) by mouth every 8 (eight) hours as needed for nausea. 12 tablet 0  . UNKNOWN TO PATIENT Oral Take 1 tablet by mouth daily. BP MEDICATION: NAME UNKNOWN    . UNKNOWN TO PATIENT Oral Take 1 capsule by mouth daily. BP MEDICATION: NAME UNKNOWN      BP 164/89  Pulse 99  Temp(Src) 99 F (37.2 C) (Oral)  Resp 20  Ht 4\' 9"  (1.448 m)  Wt 174 lb (78.926 kg)  BMI 37.65 kg/m2  SpO2 96%  Physical Exam  ED Course  Procedures (including critical care time)  Labs Reviewed  GLUCOSE, CAPILLARY - Abnormal; Notable for the following:    Glucose-Capillary 129 (*)    All other components within normal limits  LAB REPORT - SCANNED   No results found.  1. Abscess     Wound packing came out this am when pt did dressing change.  Abscess site is less swollen with decreasing surrounding erythema.  Scant purulent drainage persists with palpation of the abscess site.  Wound was copiously irrigated today again with NS until it flushed clear.  Additionally,  With gentle pressure a 1 cm long,  Cylindrical plug of sebum was expressed.  Continued probing revealed no further expressible contents of this wound.   Deeper,  Flat  Firm (quarter sized and shaped) induration palpable deep to the abscess cavity appreciated.  Possible sebum layer still exists within this lesion.  MDM  Discussed to start warm water therapy which pt agrees.  Also discussed this was probably originally a sebaceous cyst which became infected,  Hence the initial purulent drainage when opened 2 days ago.  However,  Pt may  need definitive excision to prevent recurrence.  Pt referred to derm Dr. Margo Aye for definitive care.  Pt understands and agrees.        Burgess Amor, PA 04/02/12 1314

## 2013-04-20 ENCOUNTER — Emergency Department (HOSPITAL_COMMUNITY): Payer: BC Managed Care – PPO

## 2013-04-20 ENCOUNTER — Emergency Department (HOSPITAL_COMMUNITY)
Admission: EM | Admit: 2013-04-20 | Discharge: 2013-04-20 | Disposition: A | Discharge status: Home / Self Care | Payer: BC Managed Care – PPO | Attending: Emergency Medicine | Admitting: Emergency Medicine

## 2013-04-20 ENCOUNTER — Encounter (HOSPITAL_COMMUNITY): Payer: Self-pay | Admitting: *Deleted

## 2013-04-20 DIAGNOSIS — E119 Type 2 diabetes mellitus without complications: Secondary | ICD-10-CM | POA: Insufficient documentation

## 2013-04-20 DIAGNOSIS — Y93H2 Activity, gardening and landscaping: Secondary | ICD-10-CM | POA: Insufficient documentation

## 2013-04-20 DIAGNOSIS — Y9289 Other specified places as the place of occurrence of the external cause: Secondary | ICD-10-CM | POA: Insufficient documentation

## 2013-04-20 DIAGNOSIS — I10 Essential (primary) hypertension: Secondary | ICD-10-CM | POA: Insufficient documentation

## 2013-04-20 DIAGNOSIS — Z862 Personal history of diseases of the blood and blood-forming organs and certain disorders involving the immune mechanism: Secondary | ICD-10-CM | POA: Insufficient documentation

## 2013-04-20 DIAGNOSIS — Z8639 Personal history of other endocrine, nutritional and metabolic disease: Secondary | ICD-10-CM | POA: Insufficient documentation

## 2013-04-20 DIAGNOSIS — S8991XA Unspecified injury of right lower leg, initial encounter: Secondary | ICD-10-CM

## 2013-04-20 DIAGNOSIS — R296 Repeated falls: Secondary | ICD-10-CM | POA: Insufficient documentation

## 2013-04-20 DIAGNOSIS — Z9889 Other specified postprocedural states: Secondary | ICD-10-CM | POA: Insufficient documentation

## 2013-04-20 DIAGNOSIS — Z79899 Other long term (current) drug therapy: Secondary | ICD-10-CM | POA: Insufficient documentation

## 2013-04-20 DIAGNOSIS — S8990XA Unspecified injury of unspecified lower leg, initial encounter: Secondary | ICD-10-CM | POA: Insufficient documentation

## 2013-04-20 HISTORY — DX: Disorder of thyroid, unspecified: E07.9

## 2013-04-20 HISTORY — DX: Pure hypercholesterolemia, unspecified: E78.00

## 2013-04-20 MED ORDER — KETOROLAC TROMETHAMINE 60 MG/2ML IM SOLN: 30.0000 mg | Freq: Once | INTRAMUSCULAR | Status: AC

## 2013-04-20 MED ORDER — IBUPROFEN 600 MG PO TABS: 600.0000 mg | ORAL_TABLET | Freq: Four times a day (QID) | ORAL | Status: DC | PRN

## 2013-04-20 MED ORDER — KETOROLAC TROMETHAMINE 30 MG/ML IJ SOLN
INTRAMUSCULAR | Status: AC
  Administered 2013-04-20: 30 mg
  Filled 2013-04-20: qty 1

## 2013-04-20 MED ORDER — HYDROCODONE-ACETAMINOPHEN 5-325 MG PO TABS
1.0000 | ORAL_TABLET | ORAL | Status: DC | PRN
Start: 2013-04-20 — End: 2013-08-26

## 2013-04-20 MED ORDER — HYDROCODONE-ACETAMINOPHEN 5-325 MG PO TABS
1.0000 | ORAL_TABLET | Freq: Once | ORAL | Status: AC
Start: 2013-04-20 — End: 2013-04-20
  Administered 2013-04-20: 1 via ORAL

## 2013-04-20 MED ORDER — HYDROCODONE-ACETAMINOPHEN 5-325 MG PO TABS
ORAL_TABLET | ORAL | Status: AC
  Administered 2013-04-20: 1 via ORAL
  Filled 2013-04-20: qty 1

## 2013-04-20 NOTE — ED Notes (Signed)
nad noted prior to dc. Dc instructions reviewed with pt. Ambulated out without difficulty. 2 scripts given. F/u instructed.

## 2013-04-20 NOTE — ED Notes (Signed)
Pain  Rt leg, for 1 week, fell  When mowing the grass.

## 2013-04-20 NOTE — ED Provider Notes (Signed)
History     CSN: [id number]  Arrival date & time 04/20/13  2045   First MD Initiated Contact with Patient 04/20/13 2125      Chief Complaint  Patient presents with  . Leg Pain    (Consider location/radiation/quality/duration/timing/severity/associated sxs/prior treatment) HPI [name] is a 62 y.o. female who presents to the ED with right leg pain. The pain started one week ago after she fell while mowing the grass. The pain has continued and today feels worse. She has a history of knee surgery by Dr. Ophelia Charter in [place] for degenerative joint disease. The history was provided by the patient.  Past Medical History  Diagnosis Date  . Diabetes mellitus   . Hypertension   . Thyroid disease   . Hypercholesteremia     Past Surgical History  Procedure Laterality Date  . C-sections    . Abdominal hysterectomy    . Breast surgery      History reviewed. No pertinent family history.  History  Substance Use Topics  . Smoking status: Never Smoker   . Smokeless tobacco: Not on file  . Alcohol Use: No    OB History   Grav Para Term Preterm Abortions TAB SAB Ect Mult Living                  Review of Systems  Constitutional: Negative for fever and chills.  HENT: Negative for neck pain.   Respiratory: Negative for shortness of breath.   Gastrointestinal: Negative for nausea and vomiting.  Musculoskeletal:       Right knee pain  Skin: Negative for wound.  Neurological: Negative for headaches.  Psychiatric/Behavioral: The patient is not nervous/anxious.     Allergies  Sulfonamide derivatives  Home Medications   Current Outpatient Rx  Name  Route  Sig  Dispense  Refill  . ferrous sulfate 325 (65 FE) MG tablet   Oral   Take 325 mg by mouth daily.         . fish oil-omega-3 fatty acids 1000 MG capsule   Oral   Take 1 g by mouth daily.         . metFORMIN (GLUCOPHAGE) 1000 MG tablet   Oral   Take 1,000 mg by mouth 2 (two) times daily with a meal.         . UNKNOWN TO PATIENT   Oral   Take 1 tablet by mouth daily. BP MEDICATION: NAME UNKNOWN         . UNKNOWN TO PATIENT   Oral   Take 1 capsule by mouth daily. BP MEDICATION: NAME UNKNOWN           BP 142/87  Pulse 94  Temp(Src) 97.3 F (36.3 C) (Oral)  Resp 16  Ht 4' 9.5" (1.461 m)  Wt 180 lb (81.647 kg)  BMI 38.25 kg/m2  SpO2 98%  Physical Exam  Nursing note and vitals reviewed. Constitutional: She is oriented to person, place, and time. She appears well-developed and well-nourished. No distress.  Eyes: EOM are normal.  Neck: Neck supple.  Cardiovascular: Normal rate.   Pulmonary/Chest: Effort normal.  Musculoskeletal:       Right knee: She exhibits decreased range of motion and swelling. She exhibits no deformity, no laceration, no erythema, normal alignment, no LCL laxity and normal patellar mobility. Tenderness found. Patellar tendon tenderness noted.  Neurological: She is alert and oriented to person, place, and time. No cranial nerve deficit.  Skin: Skin is warm and dry.  Psychiatric:  She has a normal mood and affect. Her behavior is normal.    ED Course  Procedures (including critical care time) Dg Knee Complete 4 Views Right  04/20/2013   *RADIOLOGY REPORT*  Clinical Data: Fall.  Right knee pain.  RIGHT KNEE - COMPLETE 4+ VIEW  Comparison: 09/16/2008  Findings: Advanced tricompartment degenerative changes, progressed since prior study.  Joint space narrowing and spurring.  Findings most pronounced in the medial patellofemoral compartments. Moderate joint effusion.  No fracture, subluxation or dislocation.  IMPRESSION: Advanced degenerative changes.  Mild joint effusion.  No acute findings.   Original Report Authenticated By: Charlett Nose, M.D.     MDM  62 y.o. female with right knee pain s/p fall. Will place in knee immobilizer, she will apply ice, elevate and follow up with Dr. Ophelia Charter. Pain management. I have reviewed this patient's vital signs, nurses notes,  appropriate labs and imaging.  I have discussed findings with the patient and plan of care and she voices understanding.    Medication List    TAKE these medications       HYDROcodone-acetaminophen 5-325 MG per tablet  Commonly known as:  NORCO/VICODIN  Take 1 tablet by mouth every 4 (four) hours as needed.     ibuprofen 600 MG tablet  Commonly known as:  ADVIL,MOTRIN  Take 1 tablet (600 mg total) by mouth every 6 (six) hours as needed for pain.      ASK your doctor about these medications       ferrous sulfate 325 (65 FE) MG tablet  Take 325 mg by mouth daily.     fish oil-omega-3 fatty acids 1000 MG capsule  Take 1 g by mouth daily.     metFORMIN 1000 MG tablet  Commonly known as:  GLUCOPHAGE  Take 1,000 mg by mouth 2 (two) times daily with a meal.     UNKNOWN TO PATIENT  Take 1 tablet by mouth daily. BP MEDICATION: NAME UNKNOWN     UNKNOWN TO PATIENT  Take 1 capsule by mouth daily. BP MEDICATION: NAME [address] NP 04/20/13 2250

## 2013-04-25 NOTE — ED Provider Notes (Signed)
Medical screening examination/treatment/procedure(s) were performed by non-physician practitioner and as supervising physician I was immediately available for consultation/collaboration.   Shelda Jakes, MD 04/25/13 [phone]

## 2013-06-15 DIAGNOSIS — E119 Type 2 diabetes mellitus without complications: Secondary | ICD-10-CM

## 2013-06-17 DIAGNOSIS — R079 Chest pain, unspecified: Secondary | ICD-10-CM

## 2013-08-26 ENCOUNTER — Encounter (HOSPITAL_COMMUNITY): Payer: Self-pay | Admitting: Emergency Medicine

## 2013-08-26 ENCOUNTER — Emergency Department (HOSPITAL_COMMUNITY): Payer: BC Managed Care – PPO

## 2013-08-26 ENCOUNTER — Emergency Department (HOSPITAL_COMMUNITY)
Admission: EM | Admit: 2013-08-26 | Discharge: 2013-08-26 | Disposition: A | Discharge status: Home / Self Care | Payer: BC Managed Care – PPO | Attending: Emergency Medicine | Admitting: Emergency Medicine

## 2013-08-26 DIAGNOSIS — E119 Type 2 diabetes mellitus without complications: Secondary | ICD-10-CM | POA: Insufficient documentation

## 2013-08-26 DIAGNOSIS — L0201 Cutaneous abscess of face: Secondary | ICD-10-CM | POA: Insufficient documentation

## 2013-08-26 DIAGNOSIS — Z8659 Personal history of other mental and behavioral disorders: Secondary | ICD-10-CM | POA: Insufficient documentation

## 2013-08-26 DIAGNOSIS — H538 Other visual disturbances: Secondary | ICD-10-CM | POA: Insufficient documentation

## 2013-08-26 DIAGNOSIS — L03211 Cellulitis of face: Secondary | ICD-10-CM | POA: Insufficient documentation

## 2013-08-26 DIAGNOSIS — I10 Essential (primary) hypertension: Secondary | ICD-10-CM | POA: Insufficient documentation

## 2013-08-26 DIAGNOSIS — L259 Unspecified contact dermatitis, unspecified cause: Secondary | ICD-10-CM | POA: Insufficient documentation

## 2013-08-26 DIAGNOSIS — Z79899 Other long term (current) drug therapy: Secondary | ICD-10-CM | POA: Insufficient documentation

## 2013-08-26 DIAGNOSIS — Z8639 Personal history of other endocrine, nutritional and metabolic disease: Secondary | ICD-10-CM | POA: Insufficient documentation

## 2013-08-26 DIAGNOSIS — Z862 Personal history of diseases of the blood and blood-forming organs and certain disorders involving the immune mechanism: Secondary | ICD-10-CM | POA: Insufficient documentation

## 2013-08-26 DIAGNOSIS — R51 Headache: Secondary | ICD-10-CM | POA: Insufficient documentation

## 2013-08-26 HISTORY — DX: Anxiety disorder, unspecified: F41.9

## 2013-08-26 LAB — BASIC METABOLIC PANEL
BUN: 22 mg/dL (ref 6–23)
CO2: 23 mEq/L (ref 19–32)
Calcium: 9.6 mg/dL (ref 8.4–10.5)
Chloride: 103 mEq/L (ref 96–112)
Creatinine, Ser: 1.08 mg/dL (ref 0.50–1.10)
GFR calc Af Amer: 63 mL/min — ABNORMAL LOW (ref >90)
GFR calc non Af Amer: 54 mL/min — ABNORMAL LOW (ref >90)
Glucose, Bld: 123 mg/dL — ABNORMAL HIGH (ref 70–99)
Potassium: 4 mEq/L (ref 3.5–5.1)
Sodium: 139 mEq/L (ref 135–145)

## 2013-08-26 LAB — CBC WITH DIFFERENTIAL/PLATELET
Basophils Absolute: 0 10*3/uL (ref 0.0–0.1)
Basophils Relative: 0 % (ref 0–1)
Eosinophils Absolute: 0.2 10*3/uL (ref 0.0–0.7)
Eosinophils Relative: 3 % (ref 0–5)
HCT: 31 % — ABNORMAL LOW (ref 36.0–46.0)
Hemoglobin: 10.3 g/dL — ABNORMAL LOW (ref 12.0–15.0)
Lymphocytes Relative: 36 % (ref 12–46)
Lymphs Abs: 2 10*3/uL (ref 0.7–4.0)
MCH: 28.1 pg (ref 26.0–34.0)
MCHC: 33.2 g/dL (ref 30.0–36.0)
MCV: 84.5 fL (ref 78.0–100.0)
Monocytes Absolute: 0.4 10*3/uL (ref 0.1–1.0)
Monocytes Relative: 7 % (ref 3–12)
Neutro Abs: 3.1 10*3/uL (ref 1.7–7.7)
Neutrophils Relative %: 55 % (ref 43–77)
Platelets: 223 10*3/uL (ref 150–400)
RBC: 3.67 MIL/uL — ABNORMAL LOW (ref 3.87–5.11)
RDW: 14.2 % (ref 11.5–15.5)
WBC: 5.7 10*3/uL (ref 4.0–10.5)

## 2013-08-26 LAB — GLUCOSE, CAPILLARY: Glucose-Capillary: 185 mg/dL — ABNORMAL HIGH (ref 70–99)

## 2013-08-26 MED ORDER — VANCOMYCIN HCL IN DEXTROSE 1-5 GM/200ML-% IV SOLN
1000.0000 mg | Freq: Once | INTRAVENOUS | Status: AC
  Administered 2013-08-26: 1000 mg via INTRAVENOUS
  Filled 2013-08-26: qty 200

## 2013-08-26 MED ORDER — OXYCODONE-ACETAMINOPHEN 5-325 MG PO TABS: 2.0000 | ORAL_TABLET | ORAL | Status: DC | PRN

## 2013-08-26 MED ORDER — DOXYCYCLINE HYCLATE 100 MG PO CAPS: 100.0000 mg | ORAL_CAPSULE | Freq: Two times a day (BID) | ORAL | Status: DC

## 2013-08-26 MED ORDER — MORPHINE SULFATE 4 MG/ML IJ SOLN
4.0000 mg | Freq: Once | INTRAMUSCULAR | Status: AC
  Administered 2013-08-26: 4 mg via INTRAVENOUS
  Filled 2013-08-26: qty 1

## 2013-08-26 MED ORDER — VANCOMYCIN HCL IN DEXTROSE 750-5 MG/150ML-% IV SOLN
750.0000 mg | Freq: Two times a day (BID) | INTRAVENOUS | Status: DC
  Filled 2013-08-26 (×3): qty 150

## 2013-08-26 MED ORDER — ONDANSETRON HCL 4 MG/2ML IJ SOLN
4.0000 mg | Freq: Once | INTRAMUSCULAR | Status: AC
  Administered 2013-08-26: 4 mg via INTRAVENOUS
  Filled 2013-08-26: qty 2

## 2013-08-26 NOTE — Progress Notes (Signed)
ANTIBIOTIC CONSULT NOTE - INITIAL  Pharmacy Consult for Vancomycin Indication: facial cellulitis  Allergies  Allergen Reactions  . Sulfonamide Derivatives     REACTION: CAUSES HIVES    Patient Measurements: Height: 4\' 11"  (149.9 cm) Weight: 180 lb (81.647 kg) IBW/kg (Calculated) : 43.2 Adjusted Body Weight:   Vital Signs: Temp: 98.3 F (36.8 C) (10/05 1521) Temp src: Oral (10/05 1521) BP: 154/72 mmHg (10/05 1521) Pulse Rate: 75 (10/05 1521) Intake/Output from previous day:   Intake/Output from this shift:    Labs:  Recent Labs  08/26/13 2100  WBC 5.7  HGB 10.3*  PLT 223  CREATININE 1.08   Estimated Creatinine Clearance: 50.6 ml/min (by C-G formula based on Cr of 1.08). No results found for this basename: VANCOTROUGH, VANCOPEAK, VANCORANDOM, GENTTROUGH, GENTPEAK, GENTRANDOM, TOBRATROUGH, TOBRAPEAK, TOBRARND, AMIKACINPEAK, AMIKACINTROU, AMIKACIN,  in the last 72 hours   Microbiology: No results found for this or any previous visit (from the past 720 hour(s)).  Medical History: Past Medical History  Diagnosis Date  . Diabetes mellitus   . Hypertension   . Thyroid disease   . Hypercholesteremia   . Anxiety     Medications:  Scheduled:  . [START ON 08/27/2013] vancomycin  750 mg Intravenous Q12H   Assessment: Right eye pain, swelling Cellulitis facial  Goal of Therapy:  Vancomycin trough level 10-15 mcg/ml  Plan:  Vancomycin 1 GM IV loading dose, then 750 mg IV every 12 hours Vancomycin trough at steady state Monitor renal function Labs per protocol Raquel James, Adelia Baptista Bennett 08/26/2013,9:48 PM

## 2013-08-26 NOTE — ED Notes (Signed)
CBG done in Triage with result of 185.

## 2013-08-26 NOTE — ED Provider Notes (Signed)
CSN: [id number]     Arrival date & time 08/26/13  1511 History  This chart was scribed for Donnetta Hutching, MD by Carl Best, ED Scribe. This patient was seen in room [facility]6/[facility] and the patient's care was started at 8:11 PM.     Chief Complaint  Patient presents with  . Facial Swelling  . Eye Pain    Patient is a 62 y.o. female presenting with eye pain. The history is provided by the patient. No language interpreter was used.  Eye Pain Associated symptoms include headaches.   HPI Comments: [name] is a 62 y.o. female with a history of DM, hypercholesteremia, and hypertension who presents to the Emergency Department complaining of swelling and pain to her right eye that she noticed when she woke up on Wednesday morning.  The patient states that she was mowing her lawn on Tuesday afternoon and felt as though something flew into her eye.  The patient lists blurred vision and headache as associated symptoms.  The patient denies fever as an associated symptom.  She states that she has been using warm compresses to open her eye in the morning.    Past Medical History  Diagnosis Date  . Diabetes mellitus   . Hypertension   . Thyroid disease   . Hypercholesteremia   . Anxiety    Past Surgical History  Procedure Laterality Date  . C-sections    . Abdominal hysterectomy    . Breast surgery     History reviewed. No pertinent family history. History  Substance Use Topics  . Smoking status: Never Smoker   . Smokeless tobacco: Never Used  . Alcohol Use: No   OB History   Grav Para Term Preterm Abortions TAB SAB Ect Mult Living   2 2 2       2      Review of Systems  Constitutional: Negative for fever.  Eyes: Positive for pain (right eye) and visual disturbance (blurred vision).  Neurological: Positive for headaches.  All other systems reviewed and are negative.    Allergies  Sulfonamide derivatives  Home Medications   Current Outpatient Rx  Name  Route  Sig  Dispense   Refill  . ferrous sulfate 325 (65 FE) MG tablet   Oral   Take 325 mg by mouth daily.         . fish oil-omega-3 fatty acids 1000 MG capsule   Oral   Take 1 g by mouth daily.         Marland Kitchen HYDROcodone-acetaminophen (NORCO/VICODIN) 5-325 MG per tablet   Oral   Take 1 tablet by mouth every 4 (four) hours as needed.   15 tablet   0   . ibuprofen (ADVIL,MOTRIN) 600 MG tablet   Oral   Take 1 tablet (600 mg total) by mouth every 6 (six) hours as needed for pain.   30 tablet   0   . metFORMIN (GLUCOPHAGE) 1000 MG tablet   Oral   Take 1,000 mg by mouth 2 (two) times daily with a meal.         . UNKNOWN TO PATIENT   Oral   Take 1 tablet by mouth daily. BP MEDICATION: NAME UNKNOWN         . UNKNOWN TO PATIENT   Oral   Take 1 capsule by mouth daily. BP MEDICATION: NAME UNKNOWN          Triage Vitals: BP 154/72  Pulse 75  Temp(Src) 98.3 F (36.8 C) (Oral)  Resp 20  Ht 4\' 11"  (1.499 m)  Wt 180 lb (81.647 kg)  BMI 36.34 kg/m2  SpO2 97%  Physical Exam  Nursing note and vitals reviewed. Constitutional: She is oriented to person, place, and time. She appears well-developed and well-nourished.  HENT:  Head: Normocephalic and atraumatic.  Tender in right temple and right cheek.    Eyes: Conjunctivae and EOM are normal. Pupils are equal, round, and reactive to light.  2x3 cm of erythema and edema to right eye  Neck: Normal range of motion. Neck supple.  Cardiovascular: Normal rate, regular rhythm and normal heart sounds.   Pulmonary/Chest: Effort normal and breath sounds normal.  Abdominal: Soft. Bowel sounds are normal.  Musculoskeletal: Normal range of motion.  Neurological: She is alert and oriented to person, place, and time.  Skin: Skin is warm and dry.  Dermatitis to right cheek.    Psychiatric: She has a normal mood and affect.    ED Course  Procedures (including critical care time)  DIAGNOSTIC STUDIES: Oxygen Saturation is 97% on room air, normal by my  interpretation.    COORDINATION OF CARE: 8:13 PM- Discussed clinical suspicion of cellulitis in the right eye and obtaining a blood work and starting the patient on IV vancomycin.  Discussed possibly admitting the patient to the ED for observation.  The patient agreed to the treatment plan.   9:49 PM- Recheck.  The patient stated that her head was hurting and requested medication to alleviate the pain.  Discussed administering pain medication to treat the headache and discharging the patient with antibiotics and pain medication.    Labs Review Labs Reviewed  GLUCOSE, CAPILLARY - Abnormal; Notable for the following:    Glucose-Capillary 185 (*)    All other components within normal limits  CBC WITH DIFFERENTIAL - Abnormal; Notable for the following:    RBC 3.67 (*)    Hemoglobin 10.3 (*)    HCT 31.0 (*)    All other components within normal limits  BASIC METABOLIC PANEL - Abnormal; Notable for the following:    Glucose, Bld 123 (*)    GFR calc non Af Amer 54 (*)    GFR calc Af Amer 63 (*)    All other components within normal limits   Imaging Review Ct Maxillofacial Wo Cm  08/26/2013   CLINICAL DATA:  Right eye pain and swelling with blurry vision  EXAM: CT MAXILLOFACIAL WITHOUT CONTRAST  TECHNIQUE: Multidetector CT imaging of the maxillofacial structures was performed. Multiplanar CT image reconstructions were also generated. A small metallic BB was placed on the right temple in order to reliably differentiate right from left.  COMPARISON:  None.  FINDINGS: Bony structures of the maxillofacial region are within normal limits. The intracranial structures are within normal limits. The orbits and their contents are unremarkable. Preseptal soft tissue swelling is noted about the right orbit. No abscess is seen. No other focal abnormality is noted.  IMPRESSION: Right preseptal swelling without focal abscess. No bony abnormality is noted.   Electronically Signed   By: Alcide Clever M.D.   On:  08/26/2013 20:43    MDM  No diagnosis found. History and physical consistent with facial cellulitis. CT scan reveals no abscess. IV vancomycin. Discharge home with oral doxycycline. Recheck Tuesday morning    I personally performed the services described in this documentation, which was scribed in my presence. The recorded information has been reviewed and is accurate.    Donnetta Hutching, MD 08/26/13 [phone]

## 2013-08-26 NOTE — ED Notes (Signed)
Pt presents with redness and swelling to right eye since Tuesday. Pt states she noticed swelling after she mowed her lawn. Pt also reports irritation to area and intermittent blurred vision and clear drainage from right eye.

## 2013-08-26 NOTE — ED Notes (Signed)
Patient c/o right eye pain and swelling. Patient reports blurred vision. Per patient mowed yard on Tuesday woke with eye swollen on Wednesday. Patient reports using warm compresses to get eye opened in the morning. Patient also c/o headache.

## 2013-08-26 NOTE — ED Notes (Signed)
Patient given discharge instruction, verbalized understand. Patient ambulatory out of the department.  

## 2014-01-14 ENCOUNTER — Encounter (HOSPITAL_COMMUNITY): Payer: Self-pay | Admitting: Emergency Medicine

## 2014-01-14 ENCOUNTER — Emergency Department (HOSPITAL_COMMUNITY)
Admission: EM | Admit: 2014-01-14 | Discharge: 2014-01-14 | Disposition: A | Discharge status: Home / Self Care | Payer: BC Managed Care – PPO | Attending: Emergency Medicine | Admitting: Emergency Medicine

## 2014-01-14 ENCOUNTER — Emergency Department (HOSPITAL_COMMUNITY): Payer: BC Managed Care – PPO

## 2014-01-14 DIAGNOSIS — Z791 Long term (current) use of non-steroidal anti-inflammatories (NSAID): Secondary | ICD-10-CM | POA: Insufficient documentation

## 2014-01-14 DIAGNOSIS — E119 Type 2 diabetes mellitus without complications: Secondary | ICD-10-CM | POA: Insufficient documentation

## 2014-01-14 DIAGNOSIS — S8990XA Unspecified injury of unspecified lower leg, initial encounter: Secondary | ICD-10-CM | POA: Insufficient documentation

## 2014-01-14 DIAGNOSIS — X500XXA Overexertion from strenuous movement or load, initial encounter: Secondary | ICD-10-CM | POA: Insufficient documentation

## 2014-01-14 DIAGNOSIS — S4980XA Other specified injuries of shoulder and upper arm, unspecified arm, initial encounter: Secondary | ICD-10-CM | POA: Insufficient documentation

## 2014-01-14 DIAGNOSIS — I10 Essential (primary) hypertension: Secondary | ICD-10-CM | POA: Insufficient documentation

## 2014-01-14 DIAGNOSIS — Y929 Unspecified place or not applicable: Secondary | ICD-10-CM | POA: Insufficient documentation

## 2014-01-14 DIAGNOSIS — Z79899 Other long term (current) drug therapy: Secondary | ICD-10-CM | POA: Insufficient documentation

## 2014-01-14 DIAGNOSIS — F411 Generalized anxiety disorder: Secondary | ICD-10-CM | POA: Insufficient documentation

## 2014-01-14 DIAGNOSIS — E7800 Pure hypercholesterolemia, unspecified: Secondary | ICD-10-CM | POA: Insufficient documentation

## 2014-01-14 DIAGNOSIS — M25569 Pain in unspecified knee: Secondary | ICD-10-CM

## 2014-01-14 DIAGNOSIS — S46909A Unspecified injury of unspecified muscle, fascia and tendon at shoulder and upper arm level, unspecified arm, initial encounter: Secondary | ICD-10-CM | POA: Insufficient documentation

## 2014-01-14 DIAGNOSIS — Y939 Activity, unspecified: Secondary | ICD-10-CM | POA: Insufficient documentation

## 2014-01-14 DIAGNOSIS — E079 Disorder of thyroid, unspecified: Secondary | ICD-10-CM | POA: Insufficient documentation

## 2014-01-14 DIAGNOSIS — S99929A Unspecified injury of unspecified foot, initial encounter: Principal | ICD-10-CM

## 2014-01-14 DIAGNOSIS — IMO0002 Reserved for concepts with insufficient information to code with codable children: Secondary | ICD-10-CM | POA: Insufficient documentation

## 2014-01-14 DIAGNOSIS — W07XXXA Fall from chair, initial encounter: Secondary | ICD-10-CM | POA: Insufficient documentation

## 2014-01-14 DIAGNOSIS — S99919A Unspecified injury of unspecified ankle, initial encounter: Principal | ICD-10-CM

## 2014-01-14 MED ORDER — HYDROCODONE-ACETAMINOPHEN 5-325 MG PO TABS
1.0000 | ORAL_TABLET | Freq: Once | ORAL | Status: AC
  Administered 2014-01-14: 1 via ORAL
  Filled 2014-01-14: qty 1

## 2014-01-14 MED ORDER — IBUPROFEN 800 MG PO TABS
800.0000 mg | ORAL_TABLET | Freq: Once | ORAL | Status: AC
  Administered 2014-01-14: 800 mg via ORAL
  Filled 2014-01-14: qty 1

## 2014-01-14 MED ORDER — HYDROCODONE-ACETAMINOPHEN 5-325 MG PO TABS: ORAL_TABLET | ORAL | Status: DC

## 2014-01-14 NOTE — ED Provider Notes (Signed)
CSN: [id number]     Arrival date & time 01/14/14  1006 History  This chart was scribed for non-physician practitioner Hale Bogus, PA-C working with Nat Christen, MD by Anastasia Pall, ED scribe. This patient was seen in room APA15/APA15 and the patient's care was started at 11:38 AM.   Chief Complaint  Patient presents with  . Leg Pain   (Consider location/radiation/quality/duration/timing/severity/associated sxs/prior Treatment) Patient is a 63 y.o. female presenting with leg pain and shoulder pain. The history is provided by the patient. No language interpreter was used.  Leg Pain Location:  Ankle and knee Time since incident:  1 day Knee location:  R knee Ankle location:  R ankle Pain details:    Quality:  Burning   Radiates to:  R leg   Onset quality:  Sudden   Duration:  1 day   Timing:  Constant   Progression:  Unchanged Ineffective treatments:  NSAIDs (Ibuprofen) Associated symptoms: no back pain, no fatigue, no fever and no neck pain   Shoulder Pain This is a new problem. The current episode started yesterday. The problem occurs constantly. The problem has not changed since onset.Pertinent negatives include no chest pain, no abdominal pain and no shortness of breath.   HPI Comments: [name] is a 63 y.o. female who presents to the Emergency Department complaining of constant right knee and ankle pain, onset yesterday after falling from a standing position on a chair landing on her right side. She reports hearing her right ankle pop, and states her right knee gave out on her when she fell. She reports a burning sensation that radiates from her right ankle up the back of her leg. She denies any right hip pain. She reports pain with ambulation. She has tried prescription Ibuprofen without relief. She reports h/o right knee issues, Cortisone injection 6 months ago by Dr. Lorin Mercy. She also reports constant, right shoulder pain. She denies LOC, head injury, headache, dizziness,  neck or back pain, wounds, and any other associated symptoms. She also denies sx's prior to the fall  PCP - Glo Herring., MD  Past Medical History  Diagnosis Date  . Diabetes mellitus   . Hypertension   . Thyroid disease   . Hypercholesteremia   . Anxiety    Past Surgical History  Procedure Laterality Date  . C-sections    . Abdominal hysterectomy    . Breast surgery     No family history on file. History  Substance Use Topics  . Smoking status: Never Smoker   . Smokeless tobacco: Never Used  . Alcohol Use: No   OB History   Grav Para Term Preterm Abortions TAB SAB Ect Mult Living   2 2 2       2      Review of Systems  Constitutional: Negative for fever, chills and fatigue.  HENT: Negative for sore throat and trouble swallowing.   Respiratory: Negative for cough, shortness of breath and wheezing.   Cardiovascular: Negative for chest pain and palpitations.  Gastrointestinal: Negative for nausea, vomiting, abdominal pain and blood in stool.  Genitourinary: Negative for dysuria, hematuria and flank pain.  Musculoskeletal: Positive for arthralgias (right ankle, knee, shoulder). Negative for back pain, myalgias, neck pain and neck stiffness.  Skin: Negative for rash.  Neurological: Negative for dizziness, syncope, weakness and numbness.  Hematological: Does not bruise/bleed easily.   Allergies  Sulfonamide derivatives  Home Medications   Current Outpatient Rx  Name  Route  Sig  Dispense  Refill  . atenolol (TENORMIN) 25 MG tablet   Oral   Take 25 mg by mouth every morning. For HEART RATE/ BLOOD PRESSURE         . DULoxetine (CYMBALTA) 60 MG capsule   Oral   Take 60 mg by mouth at bedtime. For DEPRESSION         . ferrous sulfate 325 (65 FE) MG tablet   Oral   Take 325 mg by mouth daily.         . fish oil-omega-3 fatty acids 1000 MG capsule   Oral   Take 1 g by mouth daily.         . fluticasone (FLONASE) 50 MCG/ACT nasal spray   Nasal    Place 2 sprays into the nose daily.          Marland Kitchen gabapentin (NEURONTIN) 100 MG capsule   Oral   Take 100 mg by mouth 3 (three) times daily. For NEUROPATHY         . hydrochlorothiazide (HYDRODIURIL) 25 MG tablet   Oral   Take 25 mg by mouth daily. For BLOOD PRESSURE/FLUID         . hydrOXYzine (VISTARIL) 25 MG capsule   Oral   Take 25 mg by mouth 2 (two) times daily.         Marland Kitchen ibuprofen (ADVIL,MOTRIN) 800 MG tablet   Oral   Take 800 mg by mouth 3 (three) times daily.         Marland Kitchen lamoTRIgine (LAMICTAL) 25 MG tablet   Oral   Take 25 mg by mouth daily. For MOOD         . levothyroxine (SYNTHROID, LEVOTHROID) 25 MCG tablet   Oral   Take 25 mcg by mouth every morning. For THYROID         . lisinopril-hydrochlorothiazide (PRINZIDE,ZESTORETIC) 20-25 MG per tablet   Oral   Take 1 tablet by mouth daily. For BLOOD PRESSURE         . loratadine (CLARITIN) 10 MG tablet   Oral   Take 10 mg by mouth daily. For ALLERGIES         . metFORMIN (GLUCOPHAGE) 1000 MG tablet   Oral   Take 1,000 mg by mouth 2 (two) times daily with a meal. For DIABETES/BLOOD SUGARS         . OLANZapine (ZYPREXA) 15 MG tablet   Oral   Take 15 mg by mouth daily. For BI-POLAR or AS DIRECTED BY PHYSICIAN         . pravastatin (PRAVACHOL) 20 MG tablet   Oral   Take 20 mg by mouth daily. For CHOLESTEROL         . ranitidine (ZANTAC) 300 MG tablet   Oral   Take 300 mg by mouth daily.         . traZODone (DESYREL) 50 MG tablet   Oral   Take 50 mg by mouth at bedtime. For DEPRESSION/SLEEP         . HYDROcodone-acetaminophen (NORCO/VICODIN) 5-325 MG per tablet      Take one-two tabs po q 4-6 hrs prn pain   20 tablet   0    BP 151/83  Pulse 89  Temp(Src) 98.2 F (36.8 C)  Resp 22  Ht 4\' 11"  (1.499 m)  Wt 180 lb (81.647 kg)  BMI 36.34 kg/m2  SpO2 94%  Physical Exam  Nursing note and vitals reviewed. Constitutional: She is oriented to person, place, and time. She appears  well-developed and well-nourished. No distress.  HENT:  Head: Normocephalic and atraumatic.  Mouth/Throat: Oropharynx is clear and moist.  Eyes: EOM are normal. No scleral icterus.  Neck: Normal range of motion. Neck supple.  Cardiovascular: Normal rate, regular rhythm and normal heart sounds.   No murmur heard. Pulmonary/Chest: Effort normal and breath sounds normal. No respiratory distress. She has no wheezes. She has no rales. She exhibits no tenderness.  Abdominal: Soft. She exhibits no distension. There is no tenderness.  Musculoskeletal: Normal range of motion. She exhibits tenderness.       Right shoulder: She exhibits tenderness. She exhibits no deformity.       Right knee: She exhibits effusion. She exhibits no deformity. Tenderness found.  Right knee: mild to moderate patellar crepitus and mild joint effusion present. No step off deformities, erythema or effusion. Right shoulder: Tenderness over right ac. No bony deformities. FROM.   Lymphadenopathy:    She has no cervical adenopathy.  Neurological: She is alert and oriented to person, place, and time. She exhibits normal muscle tone. Coordination normal.  Skin: Skin is warm and dry.    ED Course  Procedures (including critical care time)  DIAGNOSTIC STUDIES: Oxygen Saturation is 94% on room air, normal by my interpretation.    COORDINATION OF CARE: 12:05 PM- Discussed imaging results with pt, pain medication, and discharge. Pt complied.   Dg Ankle Complete Right  01/14/2014   CLINICAL DATA:  Fall with right ankle pain  EXAM: RIGHT ANKLE - COMPLETE 3+ VIEW  COMPARISON:  02/18/2013  FINDINGS: Three-view exam of the right ankle have shows no evidence for an acute fracture. No subluxation or dislocation. Degenerative changes are noted in the tibiotalar joint and in the subtalar region. No worrisome lytic or sclerotic osseous abnormality.  IMPRESSION: Degenerative changes as before without acute bony abnormality.   Electronically  Signed   By: Misty Stanley M.D.   On: 01/14/2014 11:52   Dg Knee Complete 4 Views Right  01/14/2014   CLINICAL DATA:  63 year old female with pain after fall. Initial encounter.  EXAM: RIGHT KNEE - COMPLETE 4+ VIEW  COMPARISON:  04/20/2013.  FINDINGS: Chronic joint space loss and degenerative spurring at the knee, severe in the medial and patellofemoral compartment. Small to moderate suprapatellar joint effusion. Patella appears intact. No acute fracture or dislocation identified. Bone mineralization is within normal limits for age.  IMPRESSION: Joint effusion and advanced degenerative changes at the right knee. No acute fracture or dislocation identified.   Electronically Signed   By: Lars Pinks M.D.   On: 01/14/2014 11:56     Medications  HYDROcodone-acetaminophen (NORCO/VICODIN) 5-325 MG per tablet 1 tablet (1 tablet Oral Given 01/14/14 1245)  ibuprofen (ADVIL,MOTRIN) tablet 800 mg (800 mg Oral Given 01/14/14 1245)    MDM   Final diagnoses:  Knee pain   X-ray results reviewed and discussed with patient.  She agrees to orthopedic f/u.  Likely sprain.    Knee immobilizer applied, pain improved, remains NV intact.  Patient is well appearing, no spinal tenderness, head injury or LOC.     I personally performed the services described in this documentation, which was scribed in my presence. The recorded information has been reviewed and is accurate.    Mosie Angus L. Vanessa , PA-C 01/16/14 2220

## 2014-01-14 NOTE — ED Notes (Signed)
nad noted prior to dc. dci nstructions reviewed. 1 script given. Escorted out by J. Wicker NT. Signature pad in room is not working.

## 2014-01-14 NOTE — ED Notes (Signed)
Pt c/o right hip/leg/foot pain after falling from standing position in chair yesterday. Denies hitting head/loc. Denies neck/back pain.

## 2014-01-14 NOTE — Discharge Instructions (Signed)
Knee Pain Knee pain can be a result of an injury or other medical conditions. Treatment will depend on the cause of your pain. HOME CARE  Only take medicine as told by your doctor.  Keep a healthy weight. Being overweight can make the knee hurt more.  Stretch before exercising or playing sports.  If there is constant knee pain, change the way you exercise. Ask your doctor for advice.  Make sure shoes fit well. Choose the right shoe for the sport or activity.  Protect your knees. Wear kneepads if needed.  Rest when you are tired. GET HELP RIGHT AWAY IF:   Your knee pain does not stop.  Your knee pain does not get better.  Your knee joint feels hot to the touch.  You have a fever. MAKE SURE YOU:   Understand these instructions.  Will watch this condition.  Will get help right away if you are not doing well or get worse. Document Released: 02/04/2009 Document Revised: 01/31/2012 Document Reviewed: 02/04/2009 [facility] Patient Information 2014 [place], [place].  Patellofemoral Syndrome If you have had pain in the front of your knee for a long time, chances are good that you have patellofemoral syndrome. The word patella refers to the kneecap. Femoral (or femur) refers to the thigh bone. That is the bone the kneecap sits on. The kneecap is shaped like a triangle. Its job is to protect the knee and to improve the efficiency of your thigh muscles (quadriceps). The underside of the kneecap is made of smooth tissue (cartilage). This lets the kneecap slide up and down as the knee moves. Sometimes this cartilage becomes soft. Your healthcare provider may say the cartilage breaks down. That is patellofemoral syndrome. It can affect one knee, or both. The condition is sometimes called patellofemoral pain syndrome. That is because the condition is painful. The pain usually gets worse with activity. Sitting for a long time with the knee bent also makes the pain worse. It usually gets better with  rest and proper treatment. CAUSES  No one is sure why some people develop this problem and others do not. Runners often get it. One name for the condition is "runner's knee." However, some people run for years and never have knee pain. Certain things seem to make patellofemoral syndrome more likely. They include:  Moving out of alignment. The kneecap is supposed to move in a straight line when the thigh muscle pulls on it. Sometimes the kneecap moves in poor alignment. That can make the knee swell and hurt. Some experts believe it also wears down the cartilage.  Injury to the kneecap.  Strain on the knee. This may occur during sports activity. Soccer, running, skiing and cycling can put excess stress on the knee.  Being flat-footed or knock-kneed. SYMPTOMS   Knee pain.  Pain under the kneecap. This is usually a dull, aching pain.  Pain in the knee when doing certain things: squatting, kneeling, going up or down stairs.  Pain in the knee when you stand up after sitting down for awhile.  Tightness in the knee.  Loss of muscle strength in the thigh.  Swelling of the knee. DIAGNOSIS  Healthcare providers often send people with knee pain to an orthopedic caregiver. This person has special training to treat problems with bones and joints. To decide what is causing your knee pain, your caregiver will probably:  Do a physical exam. This will probably include:  Asking about symptoms you have noticed.  Asking about your activities and  any injuries.  Feeling your knee. Moving it. This will help test the knee's strength. It will also check alignment (whether the knee and leg are aligned normally).  Order some tests, such as:  Imaging tests. They create pictures of the inside of the knee. Tests may include:  X-rays.  Computed tomography (CT) scan. This uses X-rays and a computer to show more detail.  Magnetic resonance imaging (MRI). This test uses magnets, radio waves and a computer  to make pictures. TREATMENT   Medication is almost always used first. It can relieve pain. It also can reduce swelling. Non-steroidal anti-inflammatory medicines (called NSAIDs) are usually suggested. Sometimes a stronger form is needed. A stronger form would require a prescription.  Other treatment may be needed after the swelling goes down. Possibilities include:  Exercise. Certain exercises can make the muscles around the knee stronger which decreases the pressure on the knee cap. This includes the thigh muscle. Certain exercises also may be suggested to increase your flexibility.  A knee brace. This gives the knee extra support and helps align the movement of the knee cap.  Orthotics. These are special shoe inserts. They can help keep your leg and knee aligned.  Surgery is sometimes needed. This is rare. Options include:  Arthroscopy. The surgeon uses a special tool to remove any damaged pieces of the kneecap. Only a few small incisions (cuts) are needed.  Realignment. This is open surgery. The goals are to reduce pressure and fix the way the kneecap moves. HOME CARE INSTRUCTIONS   Take any medication prescribed by your healthcare provider. Follow the directions carefully.  If your knee is swollen:  Put ice or cold packs on it. Do this for 20 to 30 minutes, 3 to 4 times a day.  Keep the knee raised. Make sure it is supported. Put a pillow under it.  Rest your knee. For example, take the elevator instead of the stairs for awhile. Or, take a break from sports activity that strain your knee. Try walking or swimming instead.  Whenever you are active:  Use an elastic bandage on your knee. This gives it support.  After any activity, put ice or cold packs on your knees. Do this for about 10 to 20 minutes.  Make sure you wear shoes that give good support. Make sure they are not worn down. The heels should not slant in or out. SEEK MEDICAL CARE IF:   Knee pain gets worse. Or it does  not go away, even after taking pain medicine.  Swelling does not go down.  Your thigh muscle becomes weak.  You have an oral temperature above 102 F (38.9 C). SEEK IMMEDIATE MEDICAL CARE IF:  You have an oral temperature above 102 F (38.9 C), not controlled by medicine. Document Released: 10/27/2009 Document Revised: 01/31/2012 Document Reviewed: 10/27/2009 [facility] Patient Information 2014 [place], [place].

## 2014-01-17 NOTE — ED Provider Notes (Signed)
Medical screening examination/treatment/procedure(s) were performed by non-physician practitioner and as supervising physician I was immediately available for consultation/collaboration.  EKG Interpretation   None        Nat Christen, MD 01/17/14 2037

## 2014-09-23 ENCOUNTER — Encounter (HOSPITAL_COMMUNITY): Payer: Self-pay | Admitting: Emergency Medicine

## 2015-04-27 DIAGNOSIS — L02212 Cutaneous abscess of back [any part, except buttock]: Secondary | ICD-10-CM | POA: Diagnosis not present

## 2015-04-27 DIAGNOSIS — E079 Disorder of thyroid, unspecified: Secondary | ICD-10-CM | POA: Diagnosis not present

## 2015-04-27 DIAGNOSIS — Z791 Long term (current) use of non-steroidal anti-inflammatories (NSAID): Secondary | ICD-10-CM | POA: Diagnosis not present

## 2015-04-27 DIAGNOSIS — Z7951 Long term (current) use of inhaled steroids: Secondary | ICD-10-CM | POA: Insufficient documentation

## 2015-04-27 DIAGNOSIS — E119 Type 2 diabetes mellitus without complications: Secondary | ICD-10-CM | POA: Insufficient documentation

## 2015-04-27 DIAGNOSIS — E780 Pure hypercholesterolemia: Secondary | ICD-10-CM | POA: Diagnosis not present

## 2015-04-27 DIAGNOSIS — Z79899 Other long term (current) drug therapy: Secondary | ICD-10-CM | POA: Insufficient documentation

## 2015-04-27 DIAGNOSIS — F419 Anxiety disorder, unspecified: Secondary | ICD-10-CM | POA: Insufficient documentation

## 2015-04-27 DIAGNOSIS — I10 Essential (primary) hypertension: Secondary | ICD-10-CM | POA: Diagnosis not present

## 2015-04-28 ENCOUNTER — Emergency Department (HOSPITAL_COMMUNITY)
Admission: EM | Admit: 2015-04-28 | Discharge: 2015-04-28 | Disposition: A | Discharge status: Home / Self Care | Payer: 59 | Attending: Emergency Medicine | Admitting: Emergency Medicine

## 2015-04-28 ENCOUNTER — Encounter (HOSPITAL_COMMUNITY): Payer: Self-pay | Admitting: *Deleted

## 2015-04-28 DIAGNOSIS — L0291 Cutaneous abscess, unspecified: Secondary | ICD-10-CM

## 2015-04-28 MED ORDER — POVIDONE-IODINE 10 % EX SOLN
CUTANEOUS | Status: AC
  Administered 2015-04-28: 01:00:00
  Filled 2015-04-28: qty 118

## 2015-04-28 MED ORDER — DOXYCYCLINE HYCLATE 100 MG PO CAPS: 100.0000 mg | ORAL_CAPSULE | Freq: Two times a day (BID) | ORAL | Status: DC

## 2015-04-28 MED ORDER — HYDROCODONE-ACETAMINOPHEN 5-325 MG PO TABS: 1.0000 | ORAL_TABLET | ORAL | Status: DC | PRN

## 2015-04-28 MED ORDER — LIDOCAINE HCL (PF) 2 % IJ SOLN
INTRAMUSCULAR | Status: AC
  Administered 2015-04-28: 01:00:00
  Filled 2015-04-28: qty 10

## 2015-04-28 MED ORDER — DOXYCYCLINE HYCLATE 100 MG PO TABS
100.0000 mg | ORAL_TABLET | Freq: Once | ORAL | Status: AC
  Administered 2015-04-28: 100 mg via ORAL
  Filled 2015-04-28: qty 1

## 2015-04-28 MED ORDER — ONDANSETRON HCL 4 MG PO TABS
4.0000 mg | ORAL_TABLET | Freq: Once | ORAL | Status: AC
  Administered 2015-04-28: 4 mg via ORAL
  Filled 2015-04-28: qty 1

## 2015-04-28 NOTE — ED Provider Notes (Signed)
CSN: [id number]     Arrival date & time 04/27/15  2354 History   First MD Initiated Contact with Patient 04/28/15 0025     Chief Complaint  Patient presents with  . Abscess     (Consider location/radiation/quality/duration/timing/severity/associated sxs/prior Treatment) Patient is a 64 y.o. female presenting with abscess. The history is provided by the patient.  Abscess Location:  Torso Torso abscess location:  Upper back Abscess quality: fluctuance   Abscess quality: not draining   Red streaking: no   Duration:  3 days Progression:  Worsening Chronicity:  New Context: diabetes   Relieved by:  Nothing Ineffective treatments:  None tried Associated symptoms: no anorexia, no fever and no vomiting   Risk factors: prior abscess     Past Medical History  Diagnosis Date  . Diabetes mellitus   . Hypertension   . Thyroid disease   . Hypercholesteremia   . Anxiety    Past Surgical History  Procedure Laterality Date  . C-sections    . Abdominal hysterectomy    . Breast surgery     No family history on file. History  Substance Use Topics  . Smoking status: Never Smoker   . Smokeless tobacco: Never Used  . Alcohol Use: No   OB History    Gravida Para Term Preterm AB TAB SAB Ectopic Multiple Living   2 2 2       2      Review of Systems  Constitutional: Negative for fever and activity change.       All ROS Neg except as noted in HPI  HENT: Negative for nosebleeds.   Eyes: Negative for photophobia and discharge.  Respiratory: Negative for cough, shortness of breath and wheezing.   Cardiovascular: Negative for chest pain and palpitations.  Gastrointestinal: Negative for vomiting, abdominal pain, blood in stool and anorexia.  Genitourinary: Negative for dysuria, frequency and hematuria.  Musculoskeletal: Negative for back pain, arthralgias and neck pain.  Skin: Negative.   Neurological: Negative for dizziness, seizures and speech difficulty.  Psychiatric/Behavioral:  Negative for hallucinations and confusion.      Allergies  Sulfonamide derivatives  Home Medications   Prior to Admission medications   Medication Sig Start Date End Date Taking? Authorizing Provider  atenolol (TENORMIN) 25 MG tablet Take 25 mg by mouth every morning. For HEART RATE/ BLOOD PRESSURE   Yes Historical Provider, MD  ferrous sulfate 325 (65 FE) MG tablet Take 325 mg by mouth daily.   Yes Historical Provider, MD  fish oil-omega-3 fatty acids 1000 MG capsule Take 1 g by mouth daily.   Yes Historical Provider, MD  fluticasone (FLONASE) 50 MCG/ACT nasal spray Place 2 sprays into the nose daily.  07/03/13  Yes Historical Provider, MD  gabapentin (NEURONTIN) 100 MG capsule Take 100 mg by mouth 3 (three) times daily. For NEUROPATHY   Yes Historical Provider, MD  hydrochlorothiazide (HYDRODIURIL) 25 MG tablet Take 25 mg by mouth daily. For BLOOD PRESSURE/FLUID   Yes Historical Provider, MD  hydrOXYzine (VISTARIL) 25 MG capsule Take 25 mg by mouth 2 (two) times daily. 01/01/14  Yes Historical Provider, MD  ibuprofen (ADVIL,MOTRIN) 800 MG tablet Take 800 mg by mouth 3 (three) times daily. 01/01/14  Yes Historical Provider, MD  lamoTRIgine (LAMICTAL) 25 MG tablet Take 25 mg by mouth daily. For MOOD   Yes Historical Provider, MD  levothyroxine (SYNTHROID, LEVOTHROID) 25 MCG tablet Take 25 mcg by mouth every morning. For THYROID   Yes Historical Provider, MD  lisinopril-hydrochlorothiazide Reita May)  20-25 MG per tablet Take 1 tablet by mouth daily. For BLOOD PRESSURE 07/03/13  Yes Historical Provider, MD  loratadine (CLARITIN) 10 MG tablet Take 10 mg by mouth daily. For ALLERGIES   Yes Historical Provider, MD  metFORMIN (GLUCOPHAGE) 1000 MG tablet Take 1,000 mg by mouth 2 (two) times daily with a meal. For DIABETES/BLOOD SUGARS   Yes Historical Provider, MD  OLANZapine (ZYPREXA) 15 MG tablet Take 15 mg by mouth daily. For BI-POLAR or AS DIRECTED BY PHYSICIAN   Yes Historical Provider,  MD  pravastatin (PRAVACHOL) 20 MG tablet Take 20 mg by mouth daily. For CHOLESTEROL   Yes Historical Provider, MD  ranitidine (ZANTAC) 300 MG tablet Take 300 mg by mouth daily. 01/01/14  Yes Historical Provider, MD  traZODone (DESYREL) 50 MG tablet Take 50 mg by mouth at bedtime. For DEPRESSION/SLEEP   Yes Historical Provider, MD  doxycycline (VIBRAMYCIN) 100 MG capsule Take 1 capsule (100 mg total) by mouth 2 (two) times daily. 04/28/15   Lily Kocher, PA-C  DULoxetine (CYMBALTA) 60 MG capsule Take 60 mg by mouth at bedtime. For DEPRESSION    Historical Provider, MD  HYDROcodone-acetaminophen (NORCO/VICODIN) 5-325 MG per tablet Take 1-2 tablets by mouth every 4 (four) hours as needed. 04/28/15   Lily Kocher, PA-C   BP 130/80 mmHg  Pulse 80  Temp(Src) 98.6 F (37 C) (Oral)  Resp 16  Ht 4\' 9"  (1.448 m)  Wt 180 lb (81.647 kg)  BMI 38.94 kg/m2  SpO2 95% Physical Exam  Constitutional: She is oriented to person, place, and time. She appears well-developed and well-nourished.  Non-toxic appearance.  HENT:  Head: Normocephalic.  Right Ear: Tympanic membrane and external ear normal.  Left Ear: Tympanic membrane and external ear normal.  Eyes: EOM and lids are normal. Pupils are equal, round, and reactive to light.  Neck: Normal range of motion. Neck supple. Carotid bruit is not present.  Cardiovascular: Normal rate, regular rhythm, normal heart sounds, intact distal pulses and normal pulses.   Pulmonary/Chest: Breath sounds normal. No respiratory distress.  Abdominal: Soft. Bowel sounds are normal. There is no tenderness. There is no guarding.  Musculoskeletal: Normal range of motion.       Arms: Lymphadenopathy:       Head (right side): No submandibular adenopathy present.       Head (left side): No submandibular adenopathy present.    She has no cervical adenopathy.  Neurological: She is alert and oriented to person, place, and time. She has normal strength. No cranial nerve deficit or  sensory deficit.  Skin: Skin is warm and dry.  Psychiatric: She has a normal mood and affect. Her speech is normal.  Nursing note and vitals reviewed.   ED Course  INCISION AND DRAINAGE Date/Time: 04/28/2015 1:16 AM Performed by: Lily Kocher Authorized by: Lily Kocher Consent: Verbal consent obtained. Risks and benefits: risks, benefits and alternatives were discussed Consent given by: patient Patient understanding: patient states understanding of the procedure being performed Patient identity confirmed: arm band Time out: Immediately prior to procedure a "time out" was called to verify the correct patient, procedure, equipment, support staff and site/side marked as required. Type: abscess Body area: trunk Location details: back Anesthesia: local infiltration Local anesthetic: lidocaine 2% without epinephrine Patient sedated: no Scalpel size: 15 Incision type: single straight Complexity: simple Drainage: purulent Drainage amount: moderate Wound treatment: wound left open Patient tolerance: Patient tolerated the procedure well with no immediate complications   (including critical care time) Labs Review  Labs Reviewed  CULTURE, ROUTINE-ABSCESS    Imaging Review No results found.   EKG Interpretation None      MDM  Patient noted to have a fluctuant lesion of the back. She has had this problem in the past. Vital signs are well within normal limits. Incision and drainage carried out without problem. Culture sent to the lab. Patient is placed on doxycycline and Norco. Patient will use warm tub soaks until the area heals. The patient will see her primary physician or return to the emergency department if any changes or problems.    Final diagnoses:  Abscess    *I have reviewed nursing notes, vital signs, and all appropriate lab and imaging results for this patient.[address], PA-C 04/28/15 1224  Ripley Fraise, MD 04/28/15 [phone]

## 2015-04-28 NOTE — ED Notes (Signed)
Pt  C/o ? Abscess to upper back area that started a few days ago, denies any drainage,

## 2015-04-28 NOTE — Discharge Instructions (Signed)
Please soak in a tub of warm Epsom salt water for 15 minutes daily until the wound on your back heals. Please use doxycycline 2 times daily with food until all taken. Use Norco, one or 2 tablets every 4 hours as needed for pain. Norco may cause drowsiness, please use this medication with caution. Please see your primary physician or return to the emergency department if any signs of advancing infection or if not improving. Abscess An abscess (boil or furuncle) is an infected area on or under the skin. This area is filled with yellowish-white fluid (pus) and other material (debris). HOME CARE   Only take medicines as told by your doctor.  If you were given antibiotic medicine, take it as directed. Finish the medicine even if you start to feel better.  If gauze is used, follow your doctor's directions for changing the gauze.  To avoid spreading the infection:  Keep your abscess covered with a bandage.  Wash your hands well.  Do not share personal care items, towels, or whirlpools with others.  Avoid skin contact with others.  Keep your skin and clothes clean around the abscess.  Keep all doctor visits as told. GET HELP RIGHT AWAY IF:   You have more pain, puffiness (swelling), or redness in the wound site.  You have more fluid or blood coming from the wound site.  You have muscle aches, chills, or you feel sick.  You have a fever. MAKE SURE YOU:   Understand these instructions.  Will watch your condition.  Will get help right away if you are not doing well or get worse. Document Released: 04/26/2008 Document Revised: 05/09/2012 Document Reviewed: 01/21/2012 [facility] Patient Information 2015 [place], [place]. This information is not intended to replace advice given to you by your health care provider. Make sure you discuss any questions you have with your health care provider.

## 2015-05-01 LAB — CULTURE, ROUTINE-ABSCESS

## 2015-05-02 ENCOUNTER — Telehealth: Payer: Self-pay | Admitting: *Deleted

## 2015-05-07 MED FILL — Hydrocodone-Acetaminophen Tab 5-325 MG: ORAL | Qty: 6 | Status: AC

## 2015-08-03 ENCOUNTER — Emergency Department (HOSPITAL_COMMUNITY): Payer: 59

## 2015-08-03 ENCOUNTER — Encounter (HOSPITAL_COMMUNITY): Payer: Self-pay | Admitting: Emergency Medicine

## 2015-08-03 ENCOUNTER — Emergency Department (HOSPITAL_COMMUNITY)
Admission: EM | Admit: 2015-08-03 | Discharge: 2015-08-03 | Disposition: A | Discharge status: Home / Self Care | Payer: 59 | Attending: Emergency Medicine | Admitting: Emergency Medicine

## 2015-08-03 DIAGNOSIS — Z7951 Long term (current) use of inhaled steroids: Secondary | ICD-10-CM | POA: Diagnosis not present

## 2015-08-03 DIAGNOSIS — Y998 Other external cause status: Secondary | ICD-10-CM | POA: Insufficient documentation

## 2015-08-03 DIAGNOSIS — F419 Anxiety disorder, unspecified: Secondary | ICD-10-CM | POA: Insufficient documentation

## 2015-08-03 DIAGNOSIS — E780 Pure hypercholesterolemia: Secondary | ICD-10-CM | POA: Diagnosis not present

## 2015-08-03 DIAGNOSIS — E119 Type 2 diabetes mellitus without complications: Secondary | ICD-10-CM | POA: Diagnosis not present

## 2015-08-03 DIAGNOSIS — Z79899 Other long term (current) drug therapy: Secondary | ICD-10-CM | POA: Insufficient documentation

## 2015-08-03 DIAGNOSIS — T22012A Burn of unspecified degree of left forearm, initial encounter: Secondary | ICD-10-CM | POA: Insufficient documentation

## 2015-08-03 DIAGNOSIS — T148 Other injury of unspecified body region: Secondary | ICD-10-CM | POA: Insufficient documentation

## 2015-08-03 DIAGNOSIS — T07XXXA Unspecified multiple injuries, initial encounter: Secondary | ICD-10-CM

## 2015-08-03 DIAGNOSIS — S8991XA Unspecified injury of right lower leg, initial encounter: Secondary | ICD-10-CM | POA: Diagnosis not present

## 2015-08-03 DIAGNOSIS — W108XXA Fall (on) (from) other stairs and steps, initial encounter: Secondary | ICD-10-CM | POA: Insufficient documentation

## 2015-08-03 DIAGNOSIS — Y9389 Activity, other specified: Secondary | ICD-10-CM | POA: Insufficient documentation

## 2015-08-03 DIAGNOSIS — I10 Essential (primary) hypertension: Secondary | ICD-10-CM | POA: Insufficient documentation

## 2015-08-03 DIAGNOSIS — S99912A Unspecified injury of left ankle, initial encounter: Secondary | ICD-10-CM | POA: Insufficient documentation

## 2015-08-03 DIAGNOSIS — S80212A Abrasion, left knee, initial encounter: Secondary | ICD-10-CM | POA: Diagnosis not present

## 2015-08-03 DIAGNOSIS — Y9289 Other specified places as the place of occurrence of the external cause: Secondary | ICD-10-CM | POA: Diagnosis not present

## 2015-08-03 DIAGNOSIS — E079 Disorder of thyroid, unspecified: Secondary | ICD-10-CM | POA: Insufficient documentation

## 2015-08-03 DIAGNOSIS — S3992XA Unspecified injury of lower back, initial encounter: Secondary | ICD-10-CM | POA: Diagnosis present

## 2015-08-03 LAB — CBG MONITORING, ED: Glucose-Capillary: 112 mg/dL — ABNORMAL HIGH (ref 65–99)

## 2015-08-03 MED ORDER — LEVOTHYROXINE SODIUM 25 MCG PO TABS: 25.0000 ug | ORAL_TABLET | Freq: Every morning | ORAL | Status: DC

## 2015-08-03 MED ORDER — LISINOPRIL-HYDROCHLOROTHIAZIDE 20-25 MG PO TABS: 1.0000 | ORAL_TABLET | Freq: Every day | ORAL | Status: DC

## 2015-08-03 MED ORDER — METFORMIN HCL 1000 MG PO TABS: 1000.0000 mg | ORAL_TABLET | Freq: Two times a day (BID) | ORAL | Status: DC

## 2015-08-03 MED ORDER — HYDROCODONE-ACETAMINOPHEN 5-325 MG PO TABS
2.0000 | ORAL_TABLET | Freq: Once | ORAL | Status: AC
  Administered 2015-08-03: 2 via ORAL
  Filled 2015-08-03: qty 2

## 2015-08-03 MED ORDER — HYDROCHLOROTHIAZIDE 25 MG PO TABS: 25.0000 mg | ORAL_TABLET | Freq: Every day | ORAL | Status: DC

## 2015-08-03 MED ORDER — IBUPROFEN 800 MG PO TABS: 800.0000 mg | ORAL_TABLET | Freq: Three times a day (TID) | ORAL | Status: DC

## 2015-08-03 MED ORDER — ATENOLOL 25 MG PO TABS: 25.0000 mg | ORAL_TABLET | Freq: Every morning | ORAL | Status: DC

## 2015-08-03 MED ORDER — HYDROCODONE-ACETAMINOPHEN 5-325 MG PO TABS: 2.0000 | ORAL_TABLET | ORAL | Status: DC | PRN

## 2015-08-03 NOTE — Discharge Instructions (Signed)
Contusion °A contusion is a deep bruise. Contusions are the result of an injury that caused bleeding under the skin. The contusion may turn blue, purple, or yellow. Minor injuries will give you a painless contusion, but more severe contusions may stay painful and swollen for a few weeks.  °CAUSES  °A contusion is usually caused by a blow, trauma, or direct force to an area of the body. °SYMPTOMS  °· Swelling and redness of the injured area. °· Bruising of the injured area. °· Tenderness and soreness of the injured area. °· Pain. °DIAGNOSIS  °The diagnosis can be made by taking a history and physical exam. An X-ray, CT scan, or MRI may be needed to determine if there were any associated injuries, such as fractures. °TREATMENT  °Specific treatment will depend on what area of the body was injured. In general, the best treatment for a contusion is resting, icing, elevating, and applying cold compresses to the injured area. Over-the-counter medicines may also be recommended for pain control. Ask your caregiver what the best treatment is for your contusion. °HOME CARE INSTRUCTIONS  °· Put ice on the injured area. °¨ Put ice in a plastic bag. °¨ Place a towel between your skin and the bag. °¨ Leave the ice on for 15-20 minutes, 3-4 times a day, or as directed by your health care provider. °· Only take over-the-counter or prescription medicines for pain, discomfort, or fever as directed by your caregiver. Your caregiver may recommend avoiding anti-inflammatory medicines (aspirin, ibuprofen, and naproxen) for 48 hours because these medicines may increase bruising. °· Rest the injured area. °· If possible, elevate the injured area to reduce swelling. °SEEK IMMEDIATE MEDICAL CARE IF:  °· You have increased bruising or swelling. °· You have pain that is getting worse. °· Your swelling or pain is not relieved with medicines. °MAKE SURE YOU:  °· Understand these instructions. °· Will watch your condition. °· Will get help right  away if you are not doing well or get worse. °Document Released: 08/18/2005 Document Revised: 11/13/2013 Document Reviewed: 09/13/2011 °[employer]® Patient Information ©2015 [employer]. This information is not intended to replace advice given to you by your health care provider. Make sure you discuss any questions you have with your health care provider. ° °

## 2015-08-03 NOTE — ED Notes (Addendum)
Pt reports fell down three steps after coming out of front door. Pt denies hitting head or loc. Pt reports chronic back pain, bilateral leg pain, left knee pain, and left ankle. Pt also reports is out of px medication and does not currently have a pcp.pt reports has an appointment with Dr. Nevada Crane on September 26th.

## 2015-08-03 NOTE — ED Provider Notes (Signed)
CSN: [id number]     Arrival date & time 08/03/15  1023 History  This chart was scribed for non-physician practitioner, Hollace Kinnier. Saunders Revel, working with Virgel Manifold, MD by Terressa Koyanagi, ED Scribe. This patient was seen in room APFT24/APFT24 and the patient's care was started at 11:41 AM.   Chief Complaint  Patient presents with  . Fall   The history is provided by the patient. No language interpreter was used.   HPI Comments: [name] is a 64 y.o. female, with PMHx noted below including chronic back pain, chronic left knee pain, bulging disc, HTN, DMTII (controlled with Metformin), who presents to the Emergency Department complaining of a fall onset last night after pt fell three steps, landing on her back, on a cement ground. Pt denies head trauma or LOC; but, complains of associated 8/10 back pain, bilateral leg pain, left knee pain and left ankle pain. Pt notes she heard a pop in her back immediately following the fall. Pt also complains of an associated burn to her left forearm, stating she was carrying a hot dish when she fell and the contents landed on her left forearm after the fall. Pt denies taking any measures at home to alleviate her Sx. Pt denies tobacco use or EtOH use. Pt reports an allergy to sulfa.   Past Medical History  Diagnosis Date  . Diabetes mellitus   . Hypertension   . Thyroid disease   . Hypercholesteremia   . Anxiety    Past Surgical History  Procedure Laterality Date  . C-sections    . Abdominal hysterectomy    . Breast surgery     History reviewed. No pertinent family history. Social History  Substance Use Topics  . Smoking status: Never Smoker   . Smokeless tobacco: Never Used  . Alcohol Use: No   OB History    Gravida Para Term Preterm AB TAB SAB Ectopic Multiple Living   2 2 2       2      Review of Systems  Constitutional: Negative for fever and chills.  Musculoskeletal: Positive for back pain and arthralgias (bilateral leg pain,  left knee pain, and left ankle pain ).  Skin: Positive for color change and wound (burn to left forearm).  All other systems reviewed and are negative.  Allergies  Sulfonamide derivatives  Home Medications   Prior to Admission medications   Medication Sig Start Date End Date Taking? Authorizing Provider  atenolol (TENORMIN) 25 MG tablet Take 25 mg by mouth every morning. For HEART RATE/ BLOOD PRESSURE   Yes Historical Provider, MD  DULoxetine (CYMBALTA) 60 MG capsule Take 60 mg by mouth at bedtime. For DEPRESSION   Yes Historical Provider, MD  ferrous sulfate 325 (65 FE) MG tablet Take 325 mg by mouth daily.   Yes Historical Provider, MD  fish oil-omega-3 fatty acids 1000 MG capsule Take 1 g by mouth daily.   Yes Historical Provider, MD  fluticasone (FLONASE) 50 MCG/ACT nasal spray Place 2 sprays into the nose daily.  07/03/13  Yes Historical Provider, MD  gabapentin (NEURONTIN) 100 MG capsule Take 100 mg by mouth 3 (three) times daily. For NEUROPATHY   Yes Historical Provider, MD  hydrochlorothiazide (HYDRODIURIL) 25 MG tablet Take 25 mg by mouth daily. For BLOOD PRESSURE/FLUID   Yes Historical Provider, MD  hydrOXYzine (VISTARIL) 25 MG capsule Take 25 mg by mouth 2 (two) times daily. 01/01/14  Yes Historical Provider, MD  lamoTRIgine (LAMICTAL) 25 MG tablet Take  25 mg by mouth daily. For MOOD   Yes Historical Provider, MD  levothyroxine (SYNTHROID, LEVOTHROID) 25 MCG tablet Take 25 mcg by mouth every morning. For THYROID   Yes Historical Provider, MD  lisinopril-hydrochlorothiazide (PRINZIDE,ZESTORETIC) 20-25 MG per tablet Take 1 tablet by mouth daily. For BLOOD PRESSURE 07/03/13  Yes Historical Provider, MD  loratadine (CLARITIN) 10 MG tablet Take 10 mg by mouth daily. For ALLERGIES   Yes Historical Provider, MD  metFORMIN (GLUCOPHAGE) 1000 MG tablet Take 1,000 mg by mouth 2 (two) times daily with a meal. For DIABETES/BLOOD SUGARS   Yes Historical Provider, MD  OLANZapine (ZYPREXA) 15 MG  tablet Take 15 mg by mouth daily. For BI-POLAR or AS DIRECTED BY PHYSICIAN   Yes Historical Provider, MD  pravastatin (PRAVACHOL) 20 MG tablet Take 20 mg by mouth daily. For CHOLESTEROL   Yes Historical Provider, MD  ranitidine (ZANTAC) 300 MG tablet Take 300 mg by mouth daily. 01/01/14  Yes Historical Provider, MD  traZODone (DESYREL) 50 MG tablet Take 50 mg by mouth at bedtime. For DEPRESSION/SLEEP   Yes Historical Provider, MD   Triage Vitals: BP 179/80 mmHg  Pulse 65  Temp(Src) 98.2 F (36.8 C) (Oral)  Resp 18  Ht 4\' 9"  (1.448 m)  Wt 174 lb (78.926 kg)  BMI 37.64 kg/m2  SpO2 100% Physical Exam  Constitutional: She is oriented to person, place, and time. She appears well-developed and well-nourished.  HENT:  Head: Normocephalic.  Eyes: EOM are normal.  Neck: Normal range of motion.  Pulmonary/Chest: Effort normal.  Abdominal: She exhibits no distension.  Musculoskeletal: Normal range of motion. She exhibits tenderness.  Diffusely tender thoracic spine. Abrasion to left knee. Pain with ROM left knee. Swollen left ankle and pain with ROM. Right knee, right ankle: slightly tender no swelling.   Neurological: She is alert and oriented to person, place, and time.  Skin:  Left Forearm: slight erythema, possible first degree burn.   Psychiatric: She has a normal mood and affect.  Nursing note and vitals reviewed.   ED Course  Procedures (including critical care time) DIAGNOSTIC STUDIES: Oxygen Saturation is 100% on RA, nl by my interpretation.    COORDINATION OF CARE: 11:45 AM: Discussed treatment plan which includes imaging with pt at bedside; patient verbalizes understanding and agrees with treatment plan.  Labs Review Labs Reviewed  CBG MONITORING, ED - Abnormal; Notable for the following:    Glucose-Capillary 112 (*)    All other components within normal limits    Imaging Review No results found. I have personally reviewed and evaluated these images and lab results as  part of my medical decision-making.    MDM   Final diagnoses:  Multiple contusions   Ibuprofen Hydrocodone avs   Fransico Meadow, PA-C 08/03/15 [place], MD 08/14/15 [phone]

## 2015-08-03 NOTE — ED Notes (Signed)
cbg en triage 112.

## 2015-10-14 ENCOUNTER — Encounter (HOSPITAL_COMMUNITY): Payer: Self-pay | Admitting: Emergency Medicine

## 2015-10-14 ENCOUNTER — Emergency Department (HOSPITAL_COMMUNITY)
Admission: EM | Admit: 2015-10-14 | Discharge: 2015-10-14 | Disposition: A | Discharge status: Home / Self Care | Payer: 59 | Attending: Emergency Medicine | Admitting: Emergency Medicine

## 2015-10-14 DIAGNOSIS — E7800 Pure hypercholesterolemia, unspecified: Secondary | ICD-10-CM | POA: Diagnosis not present

## 2015-10-14 DIAGNOSIS — Z7951 Long term (current) use of inhaled steroids: Secondary | ICD-10-CM | POA: Insufficient documentation

## 2015-10-14 DIAGNOSIS — R51 Headache: Secondary | ICD-10-CM | POA: Insufficient documentation

## 2015-10-14 DIAGNOSIS — E079 Disorder of thyroid, unspecified: Secondary | ICD-10-CM | POA: Insufficient documentation

## 2015-10-14 DIAGNOSIS — F419 Anxiety disorder, unspecified: Secondary | ICD-10-CM | POA: Diagnosis not present

## 2015-10-14 DIAGNOSIS — Z760 Encounter for issue of repeat prescription: Secondary | ICD-10-CM | POA: Insufficient documentation

## 2015-10-14 DIAGNOSIS — R5383 Other fatigue: Secondary | ICD-10-CM | POA: Diagnosis not present

## 2015-10-14 DIAGNOSIS — I10 Essential (primary) hypertension: Secondary | ICD-10-CM | POA: Diagnosis not present

## 2015-10-14 DIAGNOSIS — E119 Type 2 diabetes mellitus without complications: Secondary | ICD-10-CM | POA: Insufficient documentation

## 2015-10-14 DIAGNOSIS — Z79899 Other long term (current) drug therapy: Secondary | ICD-10-CM | POA: Diagnosis not present

## 2015-10-14 MED ORDER — LEVOTHYROXINE SODIUM 25 MCG PO TABS: 25.0000 ug | ORAL_TABLET | Freq: Every day | ORAL | Status: DC

## 2015-10-14 MED ORDER — ATENOLOL 25 MG PO TABS: 25.0000 mg | ORAL_TABLET | Freq: Every day | ORAL | Status: DC

## 2015-10-14 MED ORDER — METFORMIN HCL 1000 MG PO TABS: 1000.0000 mg | ORAL_TABLET | Freq: Two times a day (BID) | ORAL | Status: AC

## 2015-10-14 MED ORDER — LISINOPRIL-HYDROCHLOROTHIAZIDE 20-25 MG PO TABS: 1.0000 | ORAL_TABLET | Freq: Every day | ORAL | Status: DC

## 2015-10-14 MED ORDER — HYDROCHLOROTHIAZIDE 25 MG PO TABS: 25.0000 mg | ORAL_TABLET | Freq: Every day | ORAL | Status: DC

## 2015-10-14 NOTE — ED Notes (Signed)
Pt reports was scheduled to see a PCP today but was late for appointment. Office reported pt would have to reschedule appointment and was sent here. Pt reports is out of hypertension medication. Pt denies any symptoms at this time.

## 2015-10-14 NOTE — Discharge Instructions (Signed)
Medicine Refill at the Emergency Department  We have refilled your medicine today, but it is best for you to get refills through your primary health care provider's office. In the future, please plan ahead so you do not need to get refills from the emergency department.  If the medicine we refilled was a maintenance medicine, you may have received only enough to get you by until you are able to see your regular health care provider.     This information is not intended to replace advice given to you by your health care provider. Make sure you discuss any questions you have with your health care provider.     Document Released: 02/25/2004 Document Revised: 11/29/2014 Document Reviewed: 02/15/2014  Elsevier Interactive Patient Education 2016 [employer]

## 2015-10-14 NOTE — ED Provider Notes (Signed)
CSN: [id number]     Arrival date & time 10/14/15  1210 History   First MD Initiated Contact with Patient 10/14/15 1249     Chief Complaint  Patient presents with  . Medication Refill     (Consider location/radiation/quality/duration/timing/severity/associated sxs/prior Treatment) HPI Comments: Pt is a 64 y/o female who presents to ED with a request for medication refill. Pt states she has a hx of DM, HBP, and  Hypothyroid. She gives hx that she recently received insurance. She was scheduled to see a PCP today, but was a few minutes late and the office would not see her. He is out of medications and was anticipating getting refills with office visit today. She c/o feeling that her pressure is up because of not feeling well and headache at times. No c/o CP, SOB, or n/v/d.   The history is provided by the patient.    Past Medical History  Diagnosis Date  . Diabetes mellitus   . Hypertension   . Thyroid disease   . Hypercholesteremia   . Anxiety    Past Surgical History  Procedure Laterality Date  . C-sections    . Abdominal hysterectomy    . Breast surgery     History reviewed. No pertinent family history. Social History  Substance Use Topics  . Smoking status: Never Smoker   . Smokeless tobacco: Never Used  . Alcohol Use: No   OB History    Gravida Para Term Preterm AB TAB SAB Ectopic Multiple Living   2 2 2       2      Review of Systems  Constitutional: Positive for fatigue. Negative for activity change.       All ROS Neg except as noted in HPI  HENT: Negative for nosebleeds.   Eyes: Negative for photophobia and discharge.  Respiratory: Negative for cough, shortness of breath and wheezing.   Cardiovascular: Negative for chest pain and palpitations.  Gastrointestinal: Negative for abdominal pain and blood in stool.  Genitourinary: Negative for dysuria, frequency and hematuria.  Musculoskeletal: Positive for arthralgias. Negative for back pain and neck pain.  Skin:  Negative.   Neurological: Positive for headaches. Negative for dizziness, seizures and speech difficulty.  Psychiatric/Behavioral: Negative for hallucinations and confusion. The patient is nervous/anxious.   All other systems reviewed and are negative.     Allergies  Sulfonamide derivatives  Home Medications   Prior to Admission medications   Medication Sig Start Date End Date Taking? Authorizing Provider  atenolol (TENORMIN) 25 MG tablet Take 1 tablet (25 mg total) by mouth every morning. For HEART RATE/ BLOOD PRESSURE 08/03/15  Yes Fransico Meadow, PA-C  ferrous sulfate 325 (65 FE) MG tablet Take 325 mg by mouth daily.   Yes Historical Provider, MD  fish oil-omega-3 fatty acids 1000 MG capsule Take 1 g by mouth daily.   Yes Historical Provider, MD  fluticasone (FLONASE) 50 MCG/ACT nasal spray Place 2 sprays into the nose daily.  07/03/13  Yes Historical Provider, MD  gabapentin (NEURONTIN) 100 MG capsule Take 100 mg by mouth 3 (three) times daily. For NEUROPATHY   Yes Historical Provider, MD  hydrochlorothiazide (HYDRODIURIL) 25 MG tablet Take 1 tablet (25 mg total) by mouth daily. For BLOOD PRESSURE/FLUID 08/03/15  Yes Fransico Meadow, PA-C  levothyroxine (SYNTHROID, LEVOTHROID) 25 MCG tablet Take 1 tablet (25 mcg total) by mouth every morning. For THYROID 08/03/15  Yes Hollace Kinnier Sofia, PA-C  lisinopril-hydrochlorothiazide (PRINZIDE,ZESTORETIC) 20-25 MG per tablet Take 1 tablet by  mouth daily. For BLOOD PRESSURE 08/03/15  Yes Hollace Kinnier Sofia, PA-C  loratadine (CLARITIN) 10 MG tablet Take 10 mg by mouth daily. For ALLERGIES   Yes Historical Provider, MD  metFORMIN (GLUCOPHAGE) 1000 MG tablet Take 1 tablet (1,000 mg total) by mouth 2 (two) times daily with a meal. For DIABETES/BLOOD SUGARS 08/03/15  Yes Hollace Kinnier Sofia, PA-C  pravastatin (PRAVACHOL) 20 MG tablet Take 20 mg by mouth daily. For CHOLESTEROL   Yes Historical Provider, MD  ranitidine (ZANTAC) 300 MG tablet Take 300 mg by mouth daily.  01/01/14  Yes Historical Provider, MD  vitamin B-12 (CYANOCOBALAMIN) 1000 MCG tablet Take 1,000 mcg by mouth daily.   Yes Historical Provider, MD  vitamin C (ASCORBIC ACID) 500 MG tablet Take 500 mg by mouth daily.   Yes Historical Provider, MD  HYDROcodone-acetaminophen (NORCO/VICODIN) 5-325 MG per tablet Take 2 tablets by mouth every 4 (four) hours as needed. Patient not taking: Reported on 10/14/2015 08/03/15   Fransico Meadow, PA-C  ibuprofen (ADVIL,MOTRIN) 800 MG tablet Take 1 tablet (800 mg total) by mouth 3 (three) times daily. Patient not taking: Reported on 10/14/2015 08/03/15   Fransico Meadow, PA-C   BP 179/84 mmHg  Pulse 92  Temp(Src) 98.3 F (36.8 C) (Oral)  Resp 16  Ht 4\' 11"  (1.499 m)  Wt 78.926 kg  BMI 35.13 kg/m2  SpO2 100% Physical Exam  Constitutional: She is oriented to person, place, and time. She appears well-developed and well-nourished.  Non-toxic appearance.  HENT:  Head: Normocephalic.  Right Ear: Tympanic membrane and external ear normal.  Left Ear: Tympanic membrane and external ear normal.  Eyes: EOM and lids are normal. Pupils are equal, round, and reactive to light.  Fundoscopic exam:      The right eye shows no AV nicking, no hemorrhage and no papilledema.       The left eye shows no AV nicking, no hemorrhage and no papilledema.  Neck: Normal range of motion. Neck supple. Carotid bruit is not present.  Cardiovascular: Normal rate, regular rhythm, normal heart sounds, intact distal pulses and normal pulses.   Pulmonary/Chest: Breath sounds normal. No respiratory distress.  Abdominal: Soft. Bowel sounds are normal. There is no tenderness. There is no guarding.  Musculoskeletal: Normal range of motion.  Lymphadenopathy:       Head (right side): No submandibular adenopathy present.       Head (left side): No submandibular adenopathy present.    She has no cervical adenopathy.  Neurological: She is alert and oriented to person, place, and time. She has normal  strength. No cranial nerve deficit or sensory deficit.  Skin: Skin is warm and dry.  Psychiatric: She has a normal mood and affect. Her speech is normal.  Nursing note and vitals reviewed.   ED Course  Procedures (including critical care time) Labs Review Labs Reviewed - No data to display  Imaging Review No results found. I have personally reviewed and evaluated these images and lab results as part of my medical decision-making.   EKG Interpretation None      MDM  Blood pressure elevated. No acute findings on exam at this time. Rx for several medications given. Pt given the resource info for [facility]. Discussed with pt that future medication refills should be handled by [facility] physician or clinic. Pt acknowledges d/c information.   Final diagnoses:  Medication refill    *I have reviewed nursing notes, vital signs, and all appropriate lab and imaging  results for this patient.[address], PA-C 10/18/15 1855  Ezequiel Essex, MD 10/18/15 [phone]

## 2015-12-24 ENCOUNTER — Emergency Department (HOSPITAL_COMMUNITY)
Admission: EM | Admit: 2015-12-24 | Discharge: 2015-12-24 | Disposition: A | Discharge status: Home / Self Care | Payer: Self-pay | Attending: Emergency Medicine | Admitting: Emergency Medicine

## 2015-12-24 ENCOUNTER — Encounter (HOSPITAL_COMMUNITY): Payer: Self-pay | Admitting: Emergency Medicine

## 2015-12-24 DIAGNOSIS — F419 Anxiety disorder, unspecified: Secondary | ICD-10-CM | POA: Insufficient documentation

## 2015-12-24 DIAGNOSIS — I10 Essential (primary) hypertension: Secondary | ICD-10-CM | POA: Insufficient documentation

## 2015-12-24 DIAGNOSIS — Z79899 Other long term (current) drug therapy: Secondary | ICD-10-CM | POA: Insufficient documentation

## 2015-12-24 DIAGNOSIS — Z7984 Long term (current) use of oral hypoglycemic drugs: Secondary | ICD-10-CM | POA: Insufficient documentation

## 2015-12-24 DIAGNOSIS — Z760 Encounter for issue of repeat prescription: Secondary | ICD-10-CM | POA: Insufficient documentation

## 2015-12-24 DIAGNOSIS — E7800 Pure hypercholesterolemia, unspecified: Secondary | ICD-10-CM | POA: Insufficient documentation

## 2015-12-24 DIAGNOSIS — Z9119 Patient's noncompliance with other medical treatment and regimen: Secondary | ICD-10-CM | POA: Insufficient documentation

## 2015-12-24 DIAGNOSIS — E119 Type 2 diabetes mellitus without complications: Secondary | ICD-10-CM | POA: Insufficient documentation

## 2015-12-24 DIAGNOSIS — E079 Disorder of thyroid, unspecified: Secondary | ICD-10-CM | POA: Insufficient documentation

## 2015-12-24 DIAGNOSIS — Z7951 Long term (current) use of inhaled steroids: Secondary | ICD-10-CM | POA: Insufficient documentation

## 2015-12-24 HISTORY — DX: Patient's other noncompliance with medication regimen for other reason: Z91.148

## 2015-12-24 HISTORY — DX: Patient's other noncompliance with medication regimen: Z91.14

## 2015-12-24 LAB — I-STAT CHEM 8, ED
BUN: 12 mg/dL (ref 6–20)
Calcium, Ion: 1.17 mmol/L (ref 1.13–1.30)
Chloride: 103 mmol/L (ref 101–111)
Creatinine, Ser: 1.2 mg/dL — ABNORMAL HIGH (ref 0.44–1.00)
Glucose, Bld: 152 mg/dL — ABNORMAL HIGH (ref 65–99)
HCT: 35 % — ABNORMAL LOW (ref 36.0–46.0)
Hemoglobin: 11.9 g/dL — ABNORMAL LOW (ref 12.0–15.0)
Potassium: 4.3 mmol/L (ref 3.5–5.1)
Sodium: 142 mmol/L (ref 135–145)
TCO2: 26 mmol/L (ref 0–100)

## 2015-12-24 MED ORDER — ATENOLOL 25 MG PO TABS: 25.0000 mg | ORAL_TABLET | Freq: Every day | ORAL | Status: AC

## 2015-12-24 NOTE — Discharge Instructions (Signed)
°Emergency Department Resource Guide °1) Find a Doctor and Pay Out of Pocket °Although you won't have to find out who is covered by your insurance plan, it is a good idea to ask around and get recommendations. You will then need to call the office and see if the doctor you have chosen will accept you as a new patient and what types of options they offer for patients who are self-pay. Some doctors offer discounts or will set up payment plans for their patients who do not have insurance, but you will need to ask so you aren't surprised when you get to your appointment. ° °2) Contact Your Local Health Department °Not all health departments have doctors that can see patients for sick visits, but many do, so it is worth a call to see if yours does. If you don't know where your local health department is, you can check in your phone book. The CDC also has a tool to help you locate your state's health department, and many state websites also have listings of all of their local health departments. ° °3) Find a Walk-in Clinic °If your illness is not likely to be very severe or complicated, you may want to try a walk in clinic. These are popping up all over the country in pharmacies, drugstores, and shopping centers. They're usually staffed by nurse practitioners or physician assistants that have been trained to treat common illnesses and complaints. They're usually fairly quick and inexpensive. However, if you have serious medical issues or chronic medical problems, these are probably not your best option. ° °No Primary Care Doctor: °- Call [facility] Connect at  832-8000 - they can help you locate a primary care doctor that  accepts your insurance, provides certain services, etc. °- Physician Referral Service- 1-800-533-3463 ° °Chronic Pain Problems: °Organization         Address  Phone   Notes  °[facility]  (336) 297-2271 Patients need to be referred by their primary care doctor.  ° °Medication  Assistance: °Organization         Address  Phone   Notes  °[place] [facility] [address] °[place], [place] [address] (336) 641-8030 --Must be a resident of [place] °-- Must have NO insurance coverage whatsoever (no Medicaid/ Medicare, etc.) °-- The pt. MUST have a primary care doctor that directs their care regularly and follows them in the community °  °[facility]  (866) 331-1348   °[employer]  (888) 892-1162   ° °Agencies that provide inexpensive medical care: °Organization         Address  Phone   Notes  °[facility]  (336) 832-8035   °[facility]    (336) 832-7272   °[facility] [address] °[place], [place] [address] (336) 832-4777   °[facility] [address], °[place] (336) 271-4999   °[facility]    (336) 373-0678   °[facility]    (336) 272-1050   °[facility] ° [address], [place] Phone:  (336) 832-4444, Fax:  (336) 832-4440 Hours of Operation:  9 am - 6 pm, M-F.  Also accepts Medicaid/Medicare and self-pay.  °[facility] ° [address], [place] Phone: (336) 832-3150, Fax: (336) [phone]. Hours of Operation:  8:30 am - 5:30 pm, M-F.  Also accepts Medicaid and self-pay.  °[facility] [place] [address], [place] Phone: (336) 878-6027   °[facility] [address], [place], [place] (336)723-1848, Ext. 123 Mondays & Thursdays: 7-9 AM.  First 15 patients are seen on a first come, first serve basis. °  ° °Medicaid-accepting [place] Providers: ° °Organization         Address  Phone   Notes  °[facility] [address], [place] (336) 641-2100 Also accepts self-pay patients.  °[facility] [address], [place] ° (336) 856-9996   °[facility] [address], [place]  (336) 288-8857   °[facility] [address] [place] [address], [place] (336) 299-7000   °Veita Bland [address], [place]  ° (336) 373-1557 Only accepts [place] Access Medicaid patients after they have their name applied to their card.  ° °Self-Pay (no insurance) in [place]: ° °Organization         Address  Phone   Notes  °Sickle Cell Patients, [facility] [address], [place] (336) 832-1970   °[facility] [address], [place] (336) 832-4400   °[facility] ° [address], [place] (336) 992-4800   °[facility]/Dr. Osei-Bonsu ° [address] [place] [address], [place] or [address], [place] (336) [phone] Phone number for both [place] and [place] locations is the same.  °[facility] [address], [place] (336) 299-0000   °[facility] [address] [place] [address], [place] or [address] (336) 852-7530 °(336) 878-2260   °[facility] [address], [place] (336) 350-1642, phone; (336) 294-5005, fax Sees patients 1st and 3rd Saturday of every month.  Must not qualify for public or private insurance (i.e. Medicaid, Medicare, [place] Health Choice, Veterans' Benefits) • Household income should be no more than 200% of the poverty level •The clinic cannot treat you if you are pregnant or think you are pregnant • Sexually transmitted diseases are not treated at the clinic.  ° ° °Dental Care: °Organization         Address  Phone  Notes  °[place] [facility] [address], [place] (336) 641-6152 Accepts children up to age 21 who are enrolled in Medicaid or [place] Health Choice; pregnant women with a Medicaid card; and children who have applied for Medicaid or [place] Health Choice, but were declined, whose parents can pay a reduced fee at time of service.  °[place]  [facility] [place]  [address], [place] (336) 641-7733 Accepts children up to age 21 who are enrolled in Medicaid or [place] Health Choice; pregnant women with a Medicaid card; and children who have applied for Medicaid or [place] Health Choice, but were declined, whose parents can pay a reduced fee at time of service.  °[facility] ° [address], [place] (336) 641-4533 Patients are seen by appointment only. Walk-ins are not accepted. [facility] will see patients 18 years of age and older. °Monday - Tuesday (8am-5pm) °Most Wednesdays (8:30-5pm) °$30 per visit, cash only  °[facility] ° [address], [place] (336) 641-4533 Patients are seen by appointment only. Walk-ins are not accepted. [facility] will see patients 18 years of age and older. °One   Wednesday Evening (Monthly: Volunteer Based).  $30 per visit, cash only  °[facility]  (919) 537-3737 for adults; Children under age 4, call Graduate Pediatric Dentistry at (919) [phone]. Children aged 4-14, please call (919) [phone] to request a pediatric application. ° Dental services are provided in all areas of dental care including fillings, crowns and bridges, complete and partial dentures, implants, gum treatment, root canals, and extractions. Preventive care is also provided. Treatment is provided to both adults and children. °Patients are selected via a lottery and there is often a waiting list. °  °[facility] [address], °[place] ° (336) 763-8833 [email or web address].[email or web address] °  °[facility] [address], [place], [place] (336)723-1848, Ext. 123 Second and Fourth Thursday of each month, opens at 6:30 AM; Clinic ends at 9 AM.  Patients are seen on a first-come first-served basis, and a limited number are seen during each clinic.  ° °[facility] ° [address], [place], [place] (336) 723-7904    Eligibility Requirements °You must have lived in [place], [place], or [place] counties for at least the last three months. °  You cannot be eligible for state or federal sponsored healthcare insurance, including Veterans Administration, Medicaid, or Medicare. °  You generally cannot be eligible for healthcare insurance through your employer.  °  How to apply: °Eligibility screenings are held every Tuesday and Wednesday afternoon from 1:00 pm until 4:00 pm. You do not need an appointment for the interview!  °[facility] [address], [place], [place] 336-631-2330   °[facility]  336-342-8273   °[facility]  336-703-3100   °[facility]  336-570-6415   ° °Behavioral Health Resources in the Community: °Intensive Outpatient Programs °Organization         Address  Phone  Notes  °[facility] [address], [place], [place] 336-878-6098   °[facility] [address], [place], [place] 336-832-9800   °[facility] [address], [place], [place] ° 336-882-2125   °[facility] [address],  °[place], [place] 1-800-853-5163 or 336-641-4981   °Substance Abuse Resources °Organization         Address  Phone  Notes  °[facility]  336-882-2125   °[facility]  336-784-9470   °[facility]  336-285-9073   °[facility]  336-845-3988   °Residential & Outpatient Substance Abuse Program  1-800-659-3381   °Psychological Services °Organization         Address  Phone  Notes  °[facility]  336- 832-9600   °[facility]  336- 378-7881   °[facility] [address], [place] 1-800-853-5163 or 336-641-4981   ° °Mobile Crisis Teams °Organization         Address  Phone  Notes  °[facility], Mobile Crisis Care Unit  1-877-626-1772   °Assertive °[facility] ° [address]  [place], [place] 336-834-9664   °Sharon DeEsch [address] °[place] 336-554-5454   ° °Self-Help/Support Groups °Organization         Address  Phone             Notes  °[facility]  - variety of support groups  336- 373-1402 Call for more information  °Narcotics Anonymous (NA), [facility] [address], °[place]   2 meetings at this location  ° °  Residential Treatment Programs Organization         Address  Phone  Notes  [facility] [address],    [place]  1-567 383 3697   [facility]  [address], [place] [id number], [place], [place]   [place], [place] 331-624-1879 Admissions: 8am-3pm M-F  [facility] [place] [address],    [place], [place] [id number]   [facility] [address] [place], [place], [place]   [facility] [address],  [place], [place]   [facility] Intensive Outpatient [address], [place], [place]   [facility] ([place]  [place], [place] 1-239-661-4614 or (832)592-0750   [facility] [address] [place], [place] Accepts Medicaid  [facility] [address]  [place] 1-(780)563-1348 Substance Abuse/Addiction Treatment   [facility]         Address  Phone  Notes  [facility]  604-570-9940   Domenic Schwab, PhD [address] Arlis Porta [place], [place]   959-805-4360 or 330-772-0172   [facility] [place], [place] (337)781-2917   [facility] [address] [place], [place] 671-131-6321 Insurance/Medicaid/sponsorship through [facility] [address]                                    [place], [place] 623-684-1152 [place] [address], [place] [phone]    Dr. Adele Schilder  ([phone]   [facility] Dept. 1) [address] [place] 2) [place] 3)  [address], [place] ([phone] [phone]  [phone]   [place] [phone] or (660) [phone] (After Hours)      Take the prescription as directed. Your kidney function tests were elevated on your labs today. You will need to have your regular medical doctor recheck them at your next office visit.  Call your regular medical doctor today to schedule a follow up appointment within the next 3 days.  Return to the Emergency Department immediately sooner if worsening.

## 2015-12-24 NOTE — ED Notes (Signed)
Pt states she goes to [facility]. States the RN checked her BP yesterday and it was in the 123456 systolic. States she is supposed to take HTN medication, but is out. Pt also reports headache. Denies vision changes. AOx4

## 2015-12-24 NOTE — ED Provider Notes (Signed)
CSN: [id number]     Arrival date & time 12/24/15  1113 History   First MD Initiated Contact with Patient 12/24/15 1216     Chief Complaint  Patient presents with  . Hypertension      HPI  Pt was seen at 1225. Per pt, c/o gradual onset and persistence of constant "high blood pressure" for unknown period of time. Pt states she ran out of her BP meds 3 to 4 weeks ago. Pt went to her usual/scheduled [facility] appointment yesterday and was told her "BP was high." States she came to the ED today for refill of her BP med until she can see her PMD on 01/05/16. Denies any complaints. Denies visual changes, no focal motor weakness, no tingling/numbness in extremities, no ataxia, no slurred speech, no facial droop, no CP/SOB, no abd pain, no N/V/D.     Past Medical History  Diagnosis Date  . Diabetes mellitus   . Hypertension   . Thyroid disease   . Hypercholesteremia   . Anxiety   . Noncompliance with medication regimen    Past Surgical History  Procedure Laterality Date  . C-sections    . Abdominal hysterectomy    . Breast surgery      Social History  Substance Use Topics  . Smoking status: Never Smoker   . Smokeless tobacco: Never Used  . Alcohol Use: No   OB History    Gravida Para Term Preterm AB TAB SAB Ectopic Multiple Living   2 2 2       2      Review of Systems ROS: Statement: All systems negative except as marked or noted in the HPI; Constitutional: Negative for fever and chills. ; ; Eyes: Negative for eye pain, redness and discharge. ; ; ENMT: Negative for ear pain, hoarseness, nasal congestion, sinus pressure and sore throat. ; ; Cardiovascular: Negative for chest pain, palpitations, diaphoresis, dyspnea and peripheral edema. ; ; Respiratory: Negative for cough, wheezing and stridor. ; ; Gastrointestinal: Negative for nausea, vomiting, diarrhea, abdominal pain, blood in stool, hematemesis, jaundice and rectal bleeding. . ; ; Genitourinary: Negative for dysuria, flank pain and  hematuria. ; ; Musculoskeletal: Negative for back pain and neck pain. Negative for swelling and trauma.; ; Skin: Negative for pruritus, rash, abrasions, blisters, bruising and skin lesion.; ; Neuro: Negative for headache, lightheadedness and neck stiffness. Negative for weakness, altered level of consciousness , altered mental status, extremity weakness, paresthesias, involuntary movement, seizure and syncope.      Allergies  Sulfonamide derivatives  Home Medications   Prior to Admission medications   Medication Sig Start Date End Date Taking? Authorizing Provider  Aspirin-Salicylamide-Caffeine (BC HEADACHE) 325-95-16 MG TABS Take 1 packet by mouth daily as needed (for pain).   Yes Historical Provider, MD  ferrous sulfate 325 (65 FE) MG tablet Take 325 mg by mouth daily.   Yes Historical Provider, MD  fish oil-omega-3 fatty acids 1000 MG capsule Take 1 g by mouth daily.   Yes Historical Provider, MD  metFORMIN (GLUCOPHAGE) 1000 MG tablet Take 1 tablet (1,000 mg total) by mouth 2 (two) times daily with a meal. 10/14/15  Yes Lily Kocher, PA-C  pravastatin (PRAVACHOL) 20 MG tablet Take 20 mg by mouth daily. For CHOLESTEROL   Yes Historical Provider, MD  ranitidine (ZANTAC) 300 MG tablet Take 300 mg by mouth daily as needed for heartburn.  01/01/14  Yes Historical Provider, MD  vitamin B-12 (CYANOCOBALAMIN) 1000 MCG tablet Take 1,000 mcg by mouth daily.  Yes Historical Provider, MD  vitamin C (ASCORBIC ACID) 500 MG tablet Take 500 mg by mouth daily.   Yes Historical Provider, MD  atenolol (TENORMIN) 25 MG tablet Take 1 tablet (25 mg total) by mouth daily. Patient not taking: Reported on 12/24/2015 10/14/15   Lily Kocher, PA-C  fluticasone [facility]) 50 MCG/ACT nasal spray Place 2 sprays into the nose daily.  07/03/13   Historical Provider, MD  gabapentin (NEURONTIN) 100 MG capsule Take 100 mg by mouth 3 (three) times daily. For NEUROPATHY    Historical Provider, MD  hydrochlorothiazide  (HYDRODIURIL) 25 MG tablet Take 1 tablet (25 mg total) by mouth daily. Patient not taking: Reported on 12/24/2015 10/14/15   Lily Kocher, PA-C  levothyroxine (LEVOTHROID) 25 MCG tablet Take 1 tablet (25 mcg total) by mouth daily before breakfast. Patient not taking: Reported on 12/24/2015 10/14/15   Lily Kocher, PA-C  lisinopril-hydrochlorothiazide (PRINZIDE,ZESTORETIC) 20-25 MG tablet Take 1 tablet by mouth daily. Patient not taking: Reported on 12/24/2015 10/14/15   Lily Kocher, PA-C  loratadine (CLARITIN) 10 MG tablet Take 10 mg by mouth daily. For ALLERGIES    Historical Provider, MD   BP 162/75 mmHg  Pulse 82  Temp(Src) 98.8 F (37.1 C) (Oral)  Resp 20  Ht 4\' 11"  (1.499 m)  Wt 179 lb (81.194 kg)  BMI 36.13 kg/m2  SpO2 92% Physical Exam  1230: Physical examination:  Nursing notes reviewed; Vital signs and O2 SAT reviewed;  Constitutional: Well developed, Well nourished, Well hydrated, In no acute distress; Head:  Normocephalic, atraumatic; Eyes: EOMI, PERRL, No scleral icterus; ENMT: Mouth and pharynx normal, Mucous membranes moist; Neck: Supple, Full range of motion, No lymphadenopathy; Cardiovascular: Regular rate and rhythm, No gallop; Respiratory: Breath sounds clear & equal bilaterally, No wheezes.  Speaking full sentences with ease, Normal respiratory effort/excursion; Chest: Nontender, Movement normal; Abdomen: Soft, Nontender, Nondistended, Normal bowel sounds; Genitourinary: No CVA tenderness; Extremities: Pulses normal, No tenderness, No edema, No calf edema or asymmetry.; Neuro: AA&Ox3, Major CN grossly intact. No facial droop. Speech clear. No gross focal motor or sensory deficits in extremities. Climbs on and off stretcher easily by herself. Gait steady.; Skin: Color normal, Warm, Dry.   ED Course  Procedures (including critical care time) Labs Review  Imaging Review  I have personally reviewed and evaluated these images and lab results as part of my medical  decision-making.   EKG Interpretation None      MDM  MDM Reviewed: previous chart, nursing note and vitals Reviewed previous: labs Interpretation: labs     Results for orders placed or performed during the hospital encounter of 12/24/15  I-stat Chem 8, ED  Result Value Ref Range   Sodium 142 135 - 145 mmol/L   Potassium 4.3 3.5 - 5.1 mmol/L   Chloride 103 101 - 111 mmol/L   BUN 12 6 - 20 mg/dL   Creatinine, Ser 1.20 (H) 0.44 - 1.00 mg/dL   Glucose, Bld 152 (H) 65 - 99 mg/dL   Calcium, Ion 1.17 1.13 - 1.30 mmol/L   TCO2 26 0 - 100 mmol/L   Hemoglobin 11.9 (L) 12.0 - 15.0 g/dL   HCT 35.0 (L) 36.0 - 46.0 %    1310:  BUN/Cr mildly elevated today, though pt does have elevation of same in labs in 2010. H/H per baseline. Will rx limited supply of tenormin until pt can see PMD 01/05/16; will need labs re-check at that time. Pt strongly encouraged to take her meds as rx and f/u with  PMD regularly for good continuity of care and control of her chronic medical conditions; pt verb understanding. Dx and testing d/w pt.  Questions answered.  Verb understanding, agreeable to d/c home with outpt f/u.     Francine Graven, DO 12/28/15 2117

## 2017-04-22 DIAGNOSIS — R05 Cough: Secondary | ICD-10-CM | POA: Diagnosis not present

## 2017-04-22 DIAGNOSIS — J069 Acute upper respiratory infection, unspecified: Secondary | ICD-10-CM | POA: Diagnosis not present

## 2017-04-22 DIAGNOSIS — E119 Type 2 diabetes mellitus without complications: Secondary | ICD-10-CM | POA: Diagnosis not present

## 2017-04-22 DIAGNOSIS — I10 Essential (primary) hypertension: Secondary | ICD-10-CM | POA: Diagnosis not present

## 2017-04-22 DIAGNOSIS — R062 Wheezing: Secondary | ICD-10-CM | POA: Diagnosis not present

## 2017-04-22 DIAGNOSIS — R0602 Shortness of breath: Secondary | ICD-10-CM | POA: Diagnosis not present

## 2017-07-03 DIAGNOSIS — M25461 Effusion, right knee: Secondary | ICD-10-CM | POA: Diagnosis not present

## 2017-07-03 DIAGNOSIS — M79671 Pain in right foot: Secondary | ICD-10-CM | POA: Diagnosis not present

## 2017-07-03 DIAGNOSIS — S90111A Contusion of right great toe without damage to nail, initial encounter: Secondary | ICD-10-CM | POA: Diagnosis not present

## 2017-07-03 DIAGNOSIS — M25551 Pain in right hip: Secondary | ICD-10-CM | POA: Diagnosis not present

## 2017-07-03 DIAGNOSIS — M25561 Pain in right knee: Secondary | ICD-10-CM | POA: Diagnosis not present

## 2017-07-03 DIAGNOSIS — E119 Type 2 diabetes mellitus without complications: Secondary | ICD-10-CM | POA: Diagnosis not present

## 2017-07-03 DIAGNOSIS — S79911A Unspecified injury of right hip, initial encounter: Secondary | ICD-10-CM | POA: Diagnosis not present

## 2017-07-03 DIAGNOSIS — S8991XA Unspecified injury of right lower leg, initial encounter: Secondary | ICD-10-CM | POA: Diagnosis not present

## 2017-07-03 DIAGNOSIS — S99921A Unspecified injury of right foot, initial encounter: Secondary | ICD-10-CM | POA: Diagnosis not present

## 2017-07-03 DIAGNOSIS — I10 Essential (primary) hypertension: Secondary | ICD-10-CM | POA: Diagnosis not present

## 2017-07-08 ENCOUNTER — Telehealth (INDEPENDENT_AMBULATORY_CARE_PROVIDER_SITE_OTHER): Payer: Self-pay | Admitting: Orthopaedic Surgery

## 2017-07-08 NOTE — Telephone Encounter (Signed)
Patient called and asked for some pain medication. CB # [phone]

## 2017-07-08 NOTE — Telephone Encounter (Signed)
I tried to call patient but mailbox is full and cannot accept messages at this time. We are unable to prescribe pain medication as she has not been seen in the office recently. She will need to follow up in office.

## 2017-07-11 ENCOUNTER — Encounter (INDEPENDENT_AMBULATORY_CARE_PROVIDER_SITE_OTHER): Payer: Self-pay | Admitting: Orthopaedic Surgery

## 2017-07-11 ENCOUNTER — Ambulatory Visit (INDEPENDENT_AMBULATORY_CARE_PROVIDER_SITE_OTHER): Payer: Medicaid Other | Admitting: Orthopaedic Surgery

## 2017-07-11 VITALS — BP 199/88 | HR 55 | Ht 59.0 in | Wt 180.0 lb

## 2017-07-11 DIAGNOSIS — S8001XA Contusion of right knee, initial encounter: Secondary | ICD-10-CM

## 2017-07-11 MED ORDER — TRAMADOL HCL 50 MG PO TABS: 50.0000 mg | ORAL_TABLET | Freq: Four times a day (QID) | ORAL | 0 refills | Status: DC | PRN

## 2017-07-11 NOTE — Progress Notes (Addendum)
Office Visit Note   Patient: [name]           Date of Birth: Mar 02, 1951           MRN: [id number] Visit Date: 07/11/2017              Requested by: [address], [place], [place] [address] PCP: [facility] [facility], MD   Assessment & Plan: Visit Diagnoses:  1. Contusion of right knee, initial encounter        With fall when she tripped over her dog leash. Plan: Cervidil treatment. She can wean herself out of the knee immobilizer continue to use some ice intermittently. She states she cannot tolerate over-the-counter anti-inflammatories due to her stomach. She can use plain Tylenol and ice.  Follow-Up Instructions: No Follow-up on file.   Orders:  No orders of the defined types were placed in this encounter.  No orders of the defined types were placed in this encounter.     Procedures: No procedures performed   Clinical Data: No additional findings.   Subjective: Chief Complaint  Patient presents with  . Lower Back - Pain  . Left Knee - Pain  . Right Knee - Pain    HPI patient tripped on a dog leash on 07/03/2017 was seen at [facility] where x-rays were taken of the pelvis and hip. She has ecchymosis over her right arm. She has slight swelling of the great toe but extensor and flexor function is intact. She is concerned about her diabetes and potential for having problems with her foot.  Review of Systems 14 point review of systems positive for diabetes on tablets. High cholesterol anemia, anxiety, hypertension GERD and history of dysphasia. She's had previous MRI of her lumbar spine 2014 showed some mild narrowing at L3-4 and degenerative anterolisthesis with moderate narrowing at L4-5. She states she'll join the [employer] to work on weight loss and strengthening.   Objective: Vital Signs: BP (!) 199/88   Pulse (!) 55   Ht 4\' 11"  (1.499 m)   Wt 180 lb (81.6 kg)   BMI 36.36 kg/m   Physical Exam  Constitutional: She is oriented to  person, place, and time. She appears well-developed.  HENT:  Head: Normocephalic.  Right Ear: External ear normal.  Left Ear: External ear normal.  Eyes: Pupils are equal, round, and reactive to light.  Neck: No tracheal deviation present. No thyromegaly present.  Cardiovascular: Normal rate.   Pulmonary/Chest: Effort normal.  Abdominal: Soft.  Increased BMI  Musculoskeletal:  Patient has resolving ecchymosis over both knees. She's been using a knee immobilizer on the right knee. Ecchymosis laterally over the right arm. Great toe shows no plantar foot lesions. Shows flexion-extension. Slight erythema along the medial aspect of the night p.m. without the skin defect. No ulcers are present.  Neurological: She is alert and oriented to person, place, and time.  Skin: Skin is warm and dry.  Psychiatric: She has a normal mood and affect. Her behavior is normal.    Ortho Exam  Specialty Comments:  No specialty comments available.  Imaging: Outside x-rays of her knee demonstrates a knee degenerative osteoarthritis. X-ray of the pelvis are negative for fracture. Foot x-rays in the right are negative. Old MRI from 2014 lumbar is reviewed with the patient again.   PMFS History: Patient Active Problem List   Diagnosis Date Noted  . DM 01/21/2010  . HYPERCHOLESTEROLEMIA 01/21/2010  . ANEMIA, IRON DEFICIENCY 01/21/2010  . HEMOCCULT POSITIVE  STOOL 01/21/2010  . ANXIETY NEUROSIS 01/20/2010  . HYPERTENSION 01/20/2010  . GERD 01/20/2010  . DYSPHAGIA UNSPECIFIED 01/20/2010   Past Medical History:  Diagnosis Date  . Anxiety   . Diabetes mellitus   . Hypercholesteremia   . Hypertension   . Noncompliance with medication regimen   . Thyroid disease     No family history on file.  Past Surgical History:  Procedure Laterality Date  . ABDOMINAL HYSTERECTOMY    . BREAST SURGERY    . c-sections     Social History   Occupational History  . Not on file.   Social History Main Topics  .  Smoking status: Never Smoker  . Smokeless tobacco: Never Used  . Alcohol use No  . Drug use: No  . Sexual activity: No

## 2017-09-07 ENCOUNTER — Other Ambulatory Visit (INDEPENDENT_AMBULATORY_CARE_PROVIDER_SITE_OTHER): Payer: Self-pay | Admitting: Orthopaedic Surgery

## 2017-09-07 NOTE — Telephone Encounter (Signed)
Denied. Had knee contusion after a fall  In August. She can use tylenol or other OTC medication

## 2017-09-07 NOTE — Telephone Encounter (Signed)
Ok for refill? 

## 2017-09-08 DIAGNOSIS — Z79899 Other long term (current) drug therapy: Secondary | ICD-10-CM | POA: Diagnosis not present

## 2017-09-08 DIAGNOSIS — E119 Type 2 diabetes mellitus without complications: Secondary | ICD-10-CM | POA: Diagnosis not present

## 2017-09-08 DIAGNOSIS — I10 Essential (primary) hypertension: Secondary | ICD-10-CM | POA: Diagnosis not present

## 2017-09-08 DIAGNOSIS — Z7984 Long term (current) use of oral hypoglycemic drugs: Secondary | ICD-10-CM | POA: Diagnosis not present

## 2017-09-08 DIAGNOSIS — L723 Sebaceous cyst: Secondary | ICD-10-CM | POA: Diagnosis not present

## 2017-09-12 DIAGNOSIS — L723 Sebaceous cyst: Secondary | ICD-10-CM | POA: Diagnosis not present

## 2017-09-15 DIAGNOSIS — E7800 Pure hypercholesterolemia, unspecified: Secondary | ICD-10-CM | POA: Diagnosis not present

## 2017-09-15 DIAGNOSIS — L723 Sebaceous cyst: Secondary | ICD-10-CM | POA: Diagnosis not present

## 2017-09-15 DIAGNOSIS — M1990 Unspecified osteoarthritis, unspecified site: Secondary | ICD-10-CM | POA: Diagnosis not present

## 2017-09-15 DIAGNOSIS — K219 Gastro-esophageal reflux disease without esophagitis: Secondary | ICD-10-CM | POA: Diagnosis not present

## 2017-09-15 DIAGNOSIS — E119 Type 2 diabetes mellitus without complications: Secondary | ICD-10-CM | POA: Diagnosis not present

## 2017-09-15 DIAGNOSIS — Z882 Allergy status to sulfonamides status: Secondary | ICD-10-CM | POA: Diagnosis not present

## 2017-09-15 DIAGNOSIS — E039 Hypothyroidism, unspecified: Secondary | ICD-10-CM | POA: Diagnosis not present

## 2017-09-15 DIAGNOSIS — Z7984 Long term (current) use of oral hypoglycemic drugs: Secondary | ICD-10-CM | POA: Diagnosis not present

## 2017-09-15 DIAGNOSIS — I10 Essential (primary) hypertension: Secondary | ICD-10-CM | POA: Diagnosis not present

## 2017-09-15 DIAGNOSIS — Z79899 Other long term (current) drug therapy: Secondary | ICD-10-CM | POA: Diagnosis not present

## 2017-09-15 DIAGNOSIS — Z836 Family history of other diseases of the respiratory system: Secondary | ICD-10-CM | POA: Diagnosis not present

## 2017-09-16 DIAGNOSIS — K219 Gastro-esophageal reflux disease without esophagitis: Secondary | ICD-10-CM | POA: Diagnosis not present

## 2017-09-16 DIAGNOSIS — E119 Type 2 diabetes mellitus without complications: Secondary | ICD-10-CM | POA: Diagnosis not present

## 2017-09-16 DIAGNOSIS — Z7984 Long term (current) use of oral hypoglycemic drugs: Secondary | ICD-10-CM | POA: Diagnosis not present

## 2017-09-16 DIAGNOSIS — L723 Sebaceous cyst: Secondary | ICD-10-CM | POA: Diagnosis not present

## 2017-09-16 DIAGNOSIS — L720 Epidermal cyst: Secondary | ICD-10-CM | POA: Diagnosis not present

## 2017-09-16 DIAGNOSIS — Z882 Allergy status to sulfonamides status: Secondary | ICD-10-CM | POA: Diagnosis not present

## 2017-09-16 DIAGNOSIS — M1990 Unspecified osteoarthritis, unspecified site: Secondary | ICD-10-CM | POA: Diagnosis not present

## 2017-09-16 DIAGNOSIS — I10 Essential (primary) hypertension: Secondary | ICD-10-CM | POA: Diagnosis not present

## 2017-09-16 DIAGNOSIS — E7800 Pure hypercholesterolemia, unspecified: Secondary | ICD-10-CM | POA: Diagnosis not present

## 2017-09-16 DIAGNOSIS — L929 Granulomatous disorder of the skin and subcutaneous tissue, unspecified: Secondary | ICD-10-CM | POA: Diagnosis not present

## 2017-09-16 DIAGNOSIS — Z836 Family history of other diseases of the respiratory system: Secondary | ICD-10-CM | POA: Diagnosis not present

## 2017-09-16 DIAGNOSIS — E039 Hypothyroidism, unspecified: Secondary | ICD-10-CM | POA: Diagnosis not present

## 2017-09-16 DIAGNOSIS — Z79899 Other long term (current) drug therapy: Secondary | ICD-10-CM | POA: Diagnosis not present

## 2017-09-17 DIAGNOSIS — Z4801 Encounter for change or removal of surgical wound dressing: Secondary | ICD-10-CM | POA: Diagnosis not present

## 2017-09-17 DIAGNOSIS — E119 Type 2 diabetes mellitus without complications: Secondary | ICD-10-CM | POA: Diagnosis not present

## 2017-09-17 DIAGNOSIS — E039 Hypothyroidism, unspecified: Secondary | ICD-10-CM | POA: Diagnosis not present

## 2017-09-17 DIAGNOSIS — L723 Sebaceous cyst: Secondary | ICD-10-CM | POA: Diagnosis not present

## 2017-09-17 DIAGNOSIS — I10 Essential (primary) hypertension: Secondary | ICD-10-CM | POA: Diagnosis not present

## 2017-09-17 DIAGNOSIS — Z79891 Long term (current) use of opiate analgesic: Secondary | ICD-10-CM | POA: Diagnosis not present

## 2017-09-17 DIAGNOSIS — Z7984 Long term (current) use of oral hypoglycemic drugs: Secondary | ICD-10-CM | POA: Diagnosis not present

## 2017-09-19 DIAGNOSIS — I10 Essential (primary) hypertension: Secondary | ICD-10-CM | POA: Diagnosis not present

## 2017-09-19 DIAGNOSIS — E119 Type 2 diabetes mellitus without complications: Secondary | ICD-10-CM | POA: Diagnosis not present

## 2017-09-19 DIAGNOSIS — L723 Sebaceous cyst: Secondary | ICD-10-CM | POA: Diagnosis not present

## 2017-09-19 DIAGNOSIS — Z7984 Long term (current) use of oral hypoglycemic drugs: Secondary | ICD-10-CM | POA: Diagnosis not present

## 2017-09-19 DIAGNOSIS — Z79891 Long term (current) use of opiate analgesic: Secondary | ICD-10-CM | POA: Diagnosis not present

## 2017-09-19 DIAGNOSIS — E039 Hypothyroidism, unspecified: Secondary | ICD-10-CM | POA: Diagnosis not present

## 2017-09-19 DIAGNOSIS — Z4801 Encounter for change or removal of surgical wound dressing: Secondary | ICD-10-CM | POA: Diagnosis not present

## 2017-09-20 DIAGNOSIS — Z7984 Long term (current) use of oral hypoglycemic drugs: Secondary | ICD-10-CM | POA: Diagnosis not present

## 2017-09-20 DIAGNOSIS — L723 Sebaceous cyst: Secondary | ICD-10-CM | POA: Diagnosis not present

## 2017-09-20 DIAGNOSIS — E039 Hypothyroidism, unspecified: Secondary | ICD-10-CM | POA: Diagnosis not present

## 2017-09-20 DIAGNOSIS — Z4801 Encounter for change or removal of surgical wound dressing: Secondary | ICD-10-CM | POA: Diagnosis not present

## 2017-09-20 DIAGNOSIS — I10 Essential (primary) hypertension: Secondary | ICD-10-CM | POA: Diagnosis not present

## 2017-09-20 DIAGNOSIS — E119 Type 2 diabetes mellitus without complications: Secondary | ICD-10-CM | POA: Diagnosis not present

## 2017-09-20 DIAGNOSIS — Z79891 Long term (current) use of opiate analgesic: Secondary | ICD-10-CM | POA: Diagnosis not present

## 2017-09-23 DIAGNOSIS — E119 Type 2 diabetes mellitus without complications: Secondary | ICD-10-CM | POA: Diagnosis not present

## 2017-09-23 DIAGNOSIS — I10 Essential (primary) hypertension: Secondary | ICD-10-CM | POA: Diagnosis not present

## 2017-09-23 DIAGNOSIS — Z4801 Encounter for change or removal of surgical wound dressing: Secondary | ICD-10-CM | POA: Diagnosis not present

## 2017-09-23 DIAGNOSIS — Z7984 Long term (current) use of oral hypoglycemic drugs: Secondary | ICD-10-CM | POA: Diagnosis not present

## 2017-09-23 DIAGNOSIS — E039 Hypothyroidism, unspecified: Secondary | ICD-10-CM | POA: Diagnosis not present

## 2017-09-23 DIAGNOSIS — L723 Sebaceous cyst: Secondary | ICD-10-CM | POA: Diagnosis not present

## 2017-09-23 DIAGNOSIS — Z79891 Long term (current) use of opiate analgesic: Secondary | ICD-10-CM | POA: Diagnosis not present

## 2017-09-28 DIAGNOSIS — Z7984 Long term (current) use of oral hypoglycemic drugs: Secondary | ICD-10-CM | POA: Diagnosis not present

## 2017-09-28 DIAGNOSIS — L723 Sebaceous cyst: Secondary | ICD-10-CM | POA: Diagnosis not present

## 2017-09-28 DIAGNOSIS — E039 Hypothyroidism, unspecified: Secondary | ICD-10-CM | POA: Diagnosis not present

## 2017-09-28 DIAGNOSIS — Z79891 Long term (current) use of opiate analgesic: Secondary | ICD-10-CM | POA: Diagnosis not present

## 2017-09-28 DIAGNOSIS — E119 Type 2 diabetes mellitus without complications: Secondary | ICD-10-CM | POA: Diagnosis not present

## 2017-09-28 DIAGNOSIS — Z4801 Encounter for change or removal of surgical wound dressing: Secondary | ICD-10-CM | POA: Diagnosis not present

## 2017-09-28 DIAGNOSIS — I10 Essential (primary) hypertension: Secondary | ICD-10-CM | POA: Diagnosis not present

## 2017-09-30 DIAGNOSIS — E119 Type 2 diabetes mellitus without complications: Secondary | ICD-10-CM | POA: Diagnosis not present

## 2017-09-30 DIAGNOSIS — Z7984 Long term (current) use of oral hypoglycemic drugs: Secondary | ICD-10-CM | POA: Diagnosis not present

## 2017-09-30 DIAGNOSIS — L723 Sebaceous cyst: Secondary | ICD-10-CM | POA: Diagnosis not present

## 2017-09-30 DIAGNOSIS — Z79891 Long term (current) use of opiate analgesic: Secondary | ICD-10-CM | POA: Diagnosis not present

## 2017-09-30 DIAGNOSIS — Z4801 Encounter for change or removal of surgical wound dressing: Secondary | ICD-10-CM | POA: Diagnosis not present

## 2017-09-30 DIAGNOSIS — I10 Essential (primary) hypertension: Secondary | ICD-10-CM | POA: Diagnosis not present

## 2017-09-30 DIAGNOSIS — E039 Hypothyroidism, unspecified: Secondary | ICD-10-CM | POA: Diagnosis not present

## 2017-10-03 DIAGNOSIS — L723 Sebaceous cyst: Secondary | ICD-10-CM | POA: Diagnosis not present

## 2017-10-04 DIAGNOSIS — I10 Essential (primary) hypertension: Secondary | ICD-10-CM | POA: Diagnosis not present

## 2017-10-04 DIAGNOSIS — Z79891 Long term (current) use of opiate analgesic: Secondary | ICD-10-CM | POA: Diagnosis not present

## 2017-10-04 DIAGNOSIS — E119 Type 2 diabetes mellitus without complications: Secondary | ICD-10-CM | POA: Diagnosis not present

## 2017-10-04 DIAGNOSIS — Z4801 Encounter for change or removal of surgical wound dressing: Secondary | ICD-10-CM | POA: Diagnosis not present

## 2017-10-04 DIAGNOSIS — Z7984 Long term (current) use of oral hypoglycemic drugs: Secondary | ICD-10-CM | POA: Diagnosis not present

## 2017-10-04 DIAGNOSIS — L723 Sebaceous cyst: Secondary | ICD-10-CM | POA: Diagnosis not present

## 2017-10-04 DIAGNOSIS — E039 Hypothyroidism, unspecified: Secondary | ICD-10-CM | POA: Diagnosis not present

## 2017-10-06 DIAGNOSIS — E119 Type 2 diabetes mellitus without complications: Secondary | ICD-10-CM | POA: Diagnosis not present

## 2017-10-06 DIAGNOSIS — L723 Sebaceous cyst: Secondary | ICD-10-CM | POA: Diagnosis not present

## 2017-10-06 DIAGNOSIS — I10 Essential (primary) hypertension: Secondary | ICD-10-CM | POA: Diagnosis not present

## 2017-10-06 DIAGNOSIS — Z4801 Encounter for change or removal of surgical wound dressing: Secondary | ICD-10-CM | POA: Diagnosis not present

## 2017-10-06 DIAGNOSIS — Z79891 Long term (current) use of opiate analgesic: Secondary | ICD-10-CM | POA: Diagnosis not present

## 2017-10-06 DIAGNOSIS — E039 Hypothyroidism, unspecified: Secondary | ICD-10-CM | POA: Diagnosis not present

## 2017-10-06 DIAGNOSIS — Z7984 Long term (current) use of oral hypoglycemic drugs: Secondary | ICD-10-CM | POA: Diagnosis not present

## 2017-10-07 DIAGNOSIS — L723 Sebaceous cyst: Secondary | ICD-10-CM | POA: Diagnosis not present

## 2017-10-11 DIAGNOSIS — Z7984 Long term (current) use of oral hypoglycemic drugs: Secondary | ICD-10-CM | POA: Diagnosis not present

## 2017-10-11 DIAGNOSIS — Z4801 Encounter for change or removal of surgical wound dressing: Secondary | ICD-10-CM | POA: Diagnosis not present

## 2017-10-11 DIAGNOSIS — Z79891 Long term (current) use of opiate analgesic: Secondary | ICD-10-CM | POA: Diagnosis not present

## 2017-10-11 DIAGNOSIS — L723 Sebaceous cyst: Secondary | ICD-10-CM | POA: Diagnosis not present

## 2017-10-11 DIAGNOSIS — E119 Type 2 diabetes mellitus without complications: Secondary | ICD-10-CM | POA: Diagnosis not present

## 2017-10-11 DIAGNOSIS — E039 Hypothyroidism, unspecified: Secondary | ICD-10-CM | POA: Diagnosis not present

## 2017-10-11 DIAGNOSIS — I10 Essential (primary) hypertension: Secondary | ICD-10-CM | POA: Diagnosis not present

## 2017-10-17 DIAGNOSIS — Z4801 Encounter for change or removal of surgical wound dressing: Secondary | ICD-10-CM | POA: Diagnosis not present

## 2017-10-17 DIAGNOSIS — Z7984 Long term (current) use of oral hypoglycemic drugs: Secondary | ICD-10-CM | POA: Diagnosis not present

## 2017-10-17 DIAGNOSIS — E119 Type 2 diabetes mellitus without complications: Secondary | ICD-10-CM | POA: Diagnosis not present

## 2017-10-17 DIAGNOSIS — E039 Hypothyroidism, unspecified: Secondary | ICD-10-CM | POA: Diagnosis not present

## 2017-10-17 DIAGNOSIS — I10 Essential (primary) hypertension: Secondary | ICD-10-CM | POA: Diagnosis not present

## 2017-10-17 DIAGNOSIS — L723 Sebaceous cyst: Secondary | ICD-10-CM | POA: Diagnosis not present

## 2017-10-17 DIAGNOSIS — Z79891 Long term (current) use of opiate analgesic: Secondary | ICD-10-CM | POA: Diagnosis not present

## 2017-10-27 DIAGNOSIS — I10 Essential (primary) hypertension: Secondary | ICD-10-CM | POA: Diagnosis not present

## 2017-10-27 DIAGNOSIS — Z4801 Encounter for change or removal of surgical wound dressing: Secondary | ICD-10-CM | POA: Diagnosis not present

## 2017-10-27 DIAGNOSIS — E039 Hypothyroidism, unspecified: Secondary | ICD-10-CM | POA: Diagnosis not present

## 2017-10-27 DIAGNOSIS — L723 Sebaceous cyst: Secondary | ICD-10-CM | POA: Diagnosis not present

## 2017-10-27 DIAGNOSIS — Z79891 Long term (current) use of opiate analgesic: Secondary | ICD-10-CM | POA: Diagnosis not present

## 2017-10-27 DIAGNOSIS — Z7984 Long term (current) use of oral hypoglycemic drugs: Secondary | ICD-10-CM | POA: Diagnosis not present

## 2017-10-27 DIAGNOSIS — E119 Type 2 diabetes mellitus without complications: Secondary | ICD-10-CM | POA: Diagnosis not present

## 2017-11-01 DIAGNOSIS — E039 Hypothyroidism, unspecified: Secondary | ICD-10-CM | POA: Diagnosis not present

## 2017-11-01 DIAGNOSIS — E1165 Type 2 diabetes mellitus with hyperglycemia: Secondary | ICD-10-CM | POA: Diagnosis not present

## 2017-11-01 DIAGNOSIS — E782 Mixed hyperlipidemia: Secondary | ICD-10-CM | POA: Diagnosis not present

## 2017-11-01 DIAGNOSIS — K219 Gastro-esophageal reflux disease without esophagitis: Secondary | ICD-10-CM | POA: Diagnosis not present

## 2017-11-01 DIAGNOSIS — I10 Essential (primary) hypertension: Secondary | ICD-10-CM | POA: Diagnosis not present

## 2017-11-01 DIAGNOSIS — Z23 Encounter for immunization: Secondary | ICD-10-CM | POA: Diagnosis not present

## 2018-03-07 DIAGNOSIS — B349 Viral infection, unspecified: Secondary | ICD-10-CM | POA: Diagnosis not present

## 2018-03-07 DIAGNOSIS — E119 Type 2 diabetes mellitus without complications: Secondary | ICD-10-CM | POA: Diagnosis not present

## 2018-03-07 DIAGNOSIS — E7800 Pure hypercholesterolemia, unspecified: Secondary | ICD-10-CM | POA: Diagnosis not present

## 2018-03-07 DIAGNOSIS — J040 Acute laryngitis: Secondary | ICD-10-CM | POA: Diagnosis not present

## 2018-03-07 DIAGNOSIS — K219 Gastro-esophageal reflux disease without esophagitis: Secondary | ICD-10-CM | POA: Diagnosis not present

## 2018-03-07 DIAGNOSIS — J069 Acute upper respiratory infection, unspecified: Secondary | ICD-10-CM | POA: Diagnosis not present

## 2018-03-07 DIAGNOSIS — M1990 Unspecified osteoarthritis, unspecified site: Secondary | ICD-10-CM | POA: Diagnosis not present

## 2018-03-07 DIAGNOSIS — R05 Cough: Secondary | ICD-10-CM | POA: Diagnosis not present

## 2018-03-07 DIAGNOSIS — Z79899 Other long term (current) drug therapy: Secondary | ICD-10-CM | POA: Diagnosis not present

## 2018-03-07 DIAGNOSIS — I10 Essential (primary) hypertension: Secondary | ICD-10-CM | POA: Diagnosis not present

## 2018-03-07 DIAGNOSIS — R0602 Shortness of breath: Secondary | ICD-10-CM | POA: Diagnosis not present

## 2018-03-07 DIAGNOSIS — R042 Hemoptysis: Secondary | ICD-10-CM | POA: Diagnosis not present

## 2018-03-07 DIAGNOSIS — Z7984 Long term (current) use of oral hypoglycemic drugs: Secondary | ICD-10-CM | POA: Diagnosis not present

## 2018-03-07 DIAGNOSIS — J029 Acute pharyngitis, unspecified: Secondary | ICD-10-CM | POA: Diagnosis not present

## 2018-03-21 DIAGNOSIS — K219 Gastro-esophageal reflux disease without esophagitis: Secondary | ICD-10-CM | POA: Diagnosis not present

## 2018-03-21 DIAGNOSIS — J385 Laryngeal spasm: Secondary | ICD-10-CM | POA: Diagnosis not present

## 2018-04-03 DIAGNOSIS — D649 Anemia, unspecified: Secondary | ICD-10-CM | POA: Diagnosis not present

## 2018-04-03 DIAGNOSIS — E1142 Type 2 diabetes mellitus with diabetic polyneuropathy: Secondary | ICD-10-CM | POA: Diagnosis not present

## 2018-04-03 DIAGNOSIS — D509 Iron deficiency anemia, unspecified: Secondary | ICD-10-CM | POA: Diagnosis not present

## 2018-04-03 DIAGNOSIS — E1165 Type 2 diabetes mellitus with hyperglycemia: Secondary | ICD-10-CM | POA: Diagnosis not present

## 2018-04-03 DIAGNOSIS — E039 Hypothyroidism, unspecified: Secondary | ICD-10-CM | POA: Diagnosis not present

## 2018-04-03 DIAGNOSIS — D529 Folate deficiency anemia, unspecified: Secondary | ICD-10-CM | POA: Diagnosis not present

## 2018-04-03 DIAGNOSIS — D519 Vitamin B12 deficiency anemia, unspecified: Secondary | ICD-10-CM | POA: Diagnosis not present

## 2018-04-03 DIAGNOSIS — G6289 Other specified polyneuropathies: Secondary | ICD-10-CM | POA: Diagnosis not present

## 2018-04-03 DIAGNOSIS — K219 Gastro-esophageal reflux disease without esophagitis: Secondary | ICD-10-CM | POA: Diagnosis not present

## 2018-04-05 DIAGNOSIS — I10 Essential (primary) hypertension: Secondary | ICD-10-CM | POA: Diagnosis not present

## 2018-04-05 DIAGNOSIS — Z0001 Encounter for general adult medical examination with abnormal findings: Secondary | ICD-10-CM | POA: Diagnosis not present

## 2018-04-05 DIAGNOSIS — E782 Mixed hyperlipidemia: Secondary | ICD-10-CM | POA: Diagnosis not present

## 2018-04-05 DIAGNOSIS — Z1212 Encounter for screening for malignant neoplasm of rectum: Secondary | ICD-10-CM | POA: Diagnosis not present

## 2018-04-05 DIAGNOSIS — E039 Hypothyroidism, unspecified: Secondary | ICD-10-CM | POA: Diagnosis not present

## 2018-04-05 DIAGNOSIS — N183 Chronic kidney disease, stage 3 (moderate): Secondary | ICD-10-CM | POA: Diagnosis not present

## 2018-04-20 DIAGNOSIS — E782 Mixed hyperlipidemia: Secondary | ICD-10-CM | POA: Diagnosis not present

## 2018-04-20 DIAGNOSIS — E1122 Type 2 diabetes mellitus with diabetic chronic kidney disease: Secondary | ICD-10-CM | POA: Diagnosis not present

## 2018-04-20 DIAGNOSIS — E039 Hypothyroidism, unspecified: Secondary | ICD-10-CM | POA: Diagnosis not present

## 2018-08-08 DIAGNOSIS — E1165 Type 2 diabetes mellitus with hyperglycemia: Secondary | ICD-10-CM | POA: Diagnosis not present

## 2018-08-08 DIAGNOSIS — I10 Essential (primary) hypertension: Secondary | ICD-10-CM | POA: Diagnosis not present

## 2018-08-08 DIAGNOSIS — E782 Mixed hyperlipidemia: Secondary | ICD-10-CM | POA: Diagnosis not present

## 2018-08-08 DIAGNOSIS — Z0001 Encounter for general adult medical examination with abnormal findings: Secondary | ICD-10-CM | POA: Diagnosis not present

## 2018-08-08 DIAGNOSIS — E1142 Type 2 diabetes mellitus with diabetic polyneuropathy: Secondary | ICD-10-CM | POA: Diagnosis not present

## 2018-08-21 DIAGNOSIS — I10 Essential (primary) hypertension: Secondary | ICD-10-CM | POA: Diagnosis not present

## 2018-08-21 DIAGNOSIS — M5430 Sciatica, unspecified side: Secondary | ICD-10-CM | POA: Diagnosis not present

## 2018-08-21 DIAGNOSIS — E7800 Pure hypercholesterolemia, unspecified: Secondary | ICD-10-CM | POA: Diagnosis not present

## 2018-08-21 DIAGNOSIS — E039 Hypothyroidism, unspecified: Secondary | ICD-10-CM | POA: Diagnosis not present

## 2018-08-21 DIAGNOSIS — Z7984 Long term (current) use of oral hypoglycemic drugs: Secondary | ICD-10-CM | POA: Diagnosis not present

## 2018-08-21 DIAGNOSIS — R001 Bradycardia, unspecified: Secondary | ICD-10-CM | POA: Diagnosis not present

## 2018-09-23 DIAGNOSIS — I10 Essential (primary) hypertension: Secondary | ICD-10-CM | POA: Diagnosis not present

## 2018-09-23 DIAGNOSIS — J45901 Unspecified asthma with (acute) exacerbation: Secondary | ICD-10-CM | POA: Diagnosis not present

## 2018-11-06 DIAGNOSIS — J45901 Unspecified asthma with (acute) exacerbation: Secondary | ICD-10-CM | POA: Diagnosis not present

## 2018-11-06 DIAGNOSIS — J301 Allergic rhinitis due to pollen: Secondary | ICD-10-CM | POA: Diagnosis not present

## 2018-11-06 DIAGNOSIS — R05 Cough: Secondary | ICD-10-CM | POA: Diagnosis not present

## 2018-12-05 DIAGNOSIS — J189 Pneumonia, unspecified organism: Secondary | ICD-10-CM | POA: Diagnosis not present

## 2018-12-07 DIAGNOSIS — D649 Anemia, unspecified: Secondary | ICD-10-CM | POA: Diagnosis not present

## 2018-12-07 DIAGNOSIS — K219 Gastro-esophageal reflux disease without esophagitis: Secondary | ICD-10-CM | POA: Diagnosis not present

## 2018-12-07 DIAGNOSIS — I10 Essential (primary) hypertension: Secondary | ICD-10-CM | POA: Diagnosis not present

## 2018-12-07 DIAGNOSIS — E1142 Type 2 diabetes mellitus with diabetic polyneuropathy: Secondary | ICD-10-CM | POA: Diagnosis not present

## 2018-12-07 DIAGNOSIS — J301 Allergic rhinitis due to pollen: Secondary | ICD-10-CM | POA: Diagnosis not present

## 2018-12-07 DIAGNOSIS — E1165 Type 2 diabetes mellitus with hyperglycemia: Secondary | ICD-10-CM | POA: Diagnosis not present

## 2018-12-07 DIAGNOSIS — E782 Mixed hyperlipidemia: Secondary | ICD-10-CM | POA: Diagnosis not present

## 2018-12-07 DIAGNOSIS — D519 Vitamin B12 deficiency anemia, unspecified: Secondary | ICD-10-CM | POA: Diagnosis not present

## 2018-12-07 DIAGNOSIS — E039 Hypothyroidism, unspecified: Secondary | ICD-10-CM | POA: Diagnosis not present

## 2019-01-11 DIAGNOSIS — M85852 Other specified disorders of bone density and structure, left thigh: Secondary | ICD-10-CM | POA: Diagnosis not present

## 2019-01-11 DIAGNOSIS — M810 Age-related osteoporosis without current pathological fracture: Secondary | ICD-10-CM | POA: Diagnosis not present

## 2019-04-02 DIAGNOSIS — E1122 Type 2 diabetes mellitus with diabetic chronic kidney disease: Secondary | ICD-10-CM | POA: Diagnosis not present

## 2019-04-02 DIAGNOSIS — E1142 Type 2 diabetes mellitus with diabetic polyneuropathy: Secondary | ICD-10-CM | POA: Diagnosis not present

## 2019-04-02 DIAGNOSIS — I10 Essential (primary) hypertension: Secondary | ICD-10-CM | POA: Diagnosis not present

## 2019-04-02 DIAGNOSIS — J301 Allergic rhinitis due to pollen: Secondary | ICD-10-CM | POA: Diagnosis not present

## 2019-04-02 DIAGNOSIS — K219 Gastro-esophageal reflux disease without esophagitis: Secondary | ICD-10-CM | POA: Diagnosis not present

## 2019-04-02 DIAGNOSIS — E782 Mixed hyperlipidemia: Secondary | ICD-10-CM | POA: Diagnosis not present

## 2019-04-02 DIAGNOSIS — E1165 Type 2 diabetes mellitus with hyperglycemia: Secondary | ICD-10-CM | POA: Diagnosis not present

## 2019-06-15 DIAGNOSIS — E039 Hypothyroidism, unspecified: Secondary | ICD-10-CM | POA: Diagnosis not present

## 2019-06-15 DIAGNOSIS — M19071 Primary osteoarthritis, right ankle and foot: Secondary | ICD-10-CM | POA: Diagnosis not present

## 2019-06-15 DIAGNOSIS — W010XXA Fall on same level from slipping, tripping and stumbling without subsequent striking against object, initial encounter: Secondary | ICD-10-CM | POA: Diagnosis not present

## 2019-06-15 DIAGNOSIS — E119 Type 2 diabetes mellitus without complications: Secondary | ICD-10-CM | POA: Diagnosis not present

## 2019-06-15 DIAGNOSIS — I10 Essential (primary) hypertension: Secondary | ICD-10-CM | POA: Diagnosis not present

## 2019-06-15 DIAGNOSIS — E7800 Pure hypercholesterolemia, unspecified: Secondary | ICD-10-CM | POA: Diagnosis not present

## 2019-06-15 DIAGNOSIS — Z882 Allergy status to sulfonamides status: Secondary | ICD-10-CM | POA: Diagnosis not present

## 2019-06-15 DIAGNOSIS — Z79899 Other long term (current) drug therapy: Secondary | ICD-10-CM | POA: Diagnosis not present

## 2019-06-15 DIAGNOSIS — S93401A Sprain of unspecified ligament of right ankle, initial encounter: Secondary | ICD-10-CM | POA: Diagnosis not present

## 2019-06-15 DIAGNOSIS — K219 Gastro-esophageal reflux disease without esophagitis: Secondary | ICD-10-CM | POA: Diagnosis not present

## 2019-07-23 DIAGNOSIS — E782 Mixed hyperlipidemia: Secondary | ICD-10-CM | POA: Diagnosis not present

## 2019-07-23 DIAGNOSIS — I10 Essential (primary) hypertension: Secondary | ICD-10-CM | POA: Diagnosis not present

## 2019-08-14 DIAGNOSIS — Z23 Encounter for immunization: Secondary | ICD-10-CM | POA: Diagnosis not present

## 2019-08-14 DIAGNOSIS — E782 Mixed hyperlipidemia: Secondary | ICD-10-CM | POA: Diagnosis not present

## 2019-08-14 DIAGNOSIS — I10 Essential (primary) hypertension: Secondary | ICD-10-CM | POA: Diagnosis not present

## 2019-08-14 DIAGNOSIS — E1165 Type 2 diabetes mellitus with hyperglycemia: Secondary | ICD-10-CM | POA: Diagnosis not present

## 2019-08-14 DIAGNOSIS — E1142 Type 2 diabetes mellitus with diabetic polyneuropathy: Secondary | ICD-10-CM | POA: Diagnosis not present

## 2019-08-14 DIAGNOSIS — Z1212 Encounter for screening for malignant neoplasm of rectum: Secondary | ICD-10-CM | POA: Diagnosis not present

## 2019-08-14 DIAGNOSIS — E1122 Type 2 diabetes mellitus with diabetic chronic kidney disease: Secondary | ICD-10-CM | POA: Diagnosis not present

## 2019-08-14 DIAGNOSIS — Z0001 Encounter for general adult medical examination with abnormal findings: Secondary | ICD-10-CM | POA: Diagnosis not present

## 2019-08-14 DIAGNOSIS — K219 Gastro-esophageal reflux disease without esophagitis: Secondary | ICD-10-CM | POA: Diagnosis not present

## 2019-08-14 DIAGNOSIS — J301 Allergic rhinitis due to pollen: Secondary | ICD-10-CM | POA: Diagnosis not present

## 2019-08-15 DIAGNOSIS — E119 Type 2 diabetes mellitus without complications: Secondary | ICD-10-CM | POA: Diagnosis not present

## 2019-08-15 DIAGNOSIS — Z0100 Encounter for examination of eyes and vision without abnormal findings: Secondary | ICD-10-CM | POA: Diagnosis not present

## 2019-10-04 ENCOUNTER — Ambulatory Visit (INDEPENDENT_AMBULATORY_CARE_PROVIDER_SITE_OTHER): Payer: Medicare Other | Admitting: Otolaryngology

## 2019-10-04 DIAGNOSIS — H903 Sensorineural hearing loss, bilateral: Secondary | ICD-10-CM | POA: Diagnosis not present

## 2019-10-04 DIAGNOSIS — H2513 Age-related nuclear cataract, bilateral: Secondary | ICD-10-CM | POA: Diagnosis not present

## 2019-10-04 DIAGNOSIS — H5203 Hypermetropia, bilateral: Secondary | ICD-10-CM | POA: Diagnosis not present

## 2019-10-04 DIAGNOSIS — H9313 Tinnitus, bilateral: Secondary | ICD-10-CM | POA: Diagnosis not present

## 2019-10-04 DIAGNOSIS — H524 Presbyopia: Secondary | ICD-10-CM | POA: Diagnosis not present

## 2019-10-04 DIAGNOSIS — H52223 Regular astigmatism, bilateral: Secondary | ICD-10-CM | POA: Diagnosis not present

## 2019-10-04 DIAGNOSIS — E119 Type 2 diabetes mellitus without complications: Secondary | ICD-10-CM | POA: Diagnosis not present

## 2019-10-22 ENCOUNTER — Other Ambulatory Visit: Payer: Self-pay

## 2019-10-22 NOTE — Patient Outreach (Signed)
[facility]) Care Management  10/22/2019  BERNETA MADDOCKS [birth year] [id number]   Medication Adherence call to Mrs. Sherrine Maples Telephone call to Patient regarding Medication Adherence unable to reach patient. Mr. Steinhart is showing past due on Metformin 1000 mg under [place].   [place] Management Direct Dial [phone]  Fax [phone] [email or web address]

## 2019-10-26 ENCOUNTER — Other Ambulatory Visit: Payer: Self-pay

## 2019-10-26 NOTE — Patient Outreach (Signed)
[facility]) Care Management  10/26/2019  [name] [birth year] [id number]   Medication Adherence call to [name] Telephone call to Patient regarding Medication Adherence unable to reach patient. [name] is showing past due on Metformin 1000 mg under [place].   [place] Management Direct Dial [phone]  Fax [phone] [email or web address]

## 2019-11-03 DIAGNOSIS — N390 Urinary tract infection, site not specified: Secondary | ICD-10-CM | POA: Diagnosis not present

## 2019-11-03 DIAGNOSIS — J45909 Unspecified asthma, uncomplicated: Secondary | ICD-10-CM | POA: Diagnosis not present

## 2019-11-03 DIAGNOSIS — E7800 Pure hypercholesterolemia, unspecified: Secondary | ICD-10-CM | POA: Diagnosis not present

## 2019-11-03 DIAGNOSIS — I959 Hypotension, unspecified: Secondary | ICD-10-CM | POA: Diagnosis not present

## 2019-11-03 DIAGNOSIS — W19XXXA Unspecified fall, initial encounter: Secondary | ICD-10-CM | POA: Diagnosis not present

## 2019-11-03 DIAGNOSIS — Z7984 Long term (current) use of oral hypoglycemic drugs: Secondary | ICD-10-CM | POA: Diagnosis not present

## 2019-11-03 DIAGNOSIS — R5381 Other malaise: Secondary | ICD-10-CM | POA: Diagnosis not present

## 2019-11-03 DIAGNOSIS — U071 COVID-19: Secondary | ICD-10-CM | POA: Diagnosis not present

## 2019-11-03 DIAGNOSIS — R42 Dizziness and giddiness: Secondary | ICD-10-CM | POA: Diagnosis not present

## 2019-11-03 DIAGNOSIS — I10 Essential (primary) hypertension: Secondary | ICD-10-CM | POA: Diagnosis not present

## 2019-11-03 DIAGNOSIS — K219 Gastro-esophageal reflux disease without esophagitis: Secondary | ICD-10-CM | POA: Diagnosis not present

## 2019-11-03 DIAGNOSIS — E039 Hypothyroidism, unspecified: Secondary | ICD-10-CM | POA: Diagnosis not present

## 2019-11-03 DIAGNOSIS — R918 Other nonspecific abnormal finding of lung field: Secondary | ICD-10-CM | POA: Diagnosis not present

## 2019-11-03 DIAGNOSIS — E119 Type 2 diabetes mellitus without complications: Secondary | ICD-10-CM | POA: Diagnosis not present

## 2019-11-03 DIAGNOSIS — Z79899 Other long term (current) drug therapy: Secondary | ICD-10-CM | POA: Diagnosis not present

## 2019-11-03 DIAGNOSIS — R0902 Hypoxemia: Secondary | ICD-10-CM | POA: Diagnosis not present

## 2019-11-08 ENCOUNTER — Telehealth: Payer: Self-pay

## 2019-11-08 NOTE — Telephone Encounter (Signed)
[name] from [place] called to get phone number for pt so she can give updates on Covid test. Gave numbers on file.   [place]

## 2019-11-09 DIAGNOSIS — J1289 Other viral pneumonia: Secondary | ICD-10-CM | POA: Diagnosis not present

## 2019-11-09 DIAGNOSIS — U071 COVID-19: Secondary | ICD-10-CM | POA: Diagnosis not present

## 2020-01-03 ENCOUNTER — Encounter: Payer: Self-pay | Admitting: Internal Medicine

## 2020-02-08 DIAGNOSIS — K219 Gastro-esophageal reflux disease without esophagitis: Secondary | ICD-10-CM | POA: Diagnosis not present

## 2020-02-08 DIAGNOSIS — E039 Hypothyroidism, unspecified: Secondary | ICD-10-CM | POA: Diagnosis not present

## 2020-02-08 DIAGNOSIS — N183 Chronic kidney disease, stage 3 unspecified: Secondary | ICD-10-CM | POA: Diagnosis not present

## 2020-02-08 DIAGNOSIS — E1165 Type 2 diabetes mellitus with hyperglycemia: Secondary | ICD-10-CM | POA: Diagnosis not present

## 2020-02-08 DIAGNOSIS — E1142 Type 2 diabetes mellitus with diabetic polyneuropathy: Secondary | ICD-10-CM | POA: Diagnosis not present

## 2020-02-08 DIAGNOSIS — E782 Mixed hyperlipidemia: Secondary | ICD-10-CM | POA: Diagnosis not present

## 2020-02-08 DIAGNOSIS — I10 Essential (primary) hypertension: Secondary | ICD-10-CM | POA: Diagnosis not present

## 2020-02-08 DIAGNOSIS — E1122 Type 2 diabetes mellitus with diabetic chronic kidney disease: Secondary | ICD-10-CM | POA: Diagnosis not present

## 2020-02-09 DIAGNOSIS — N183 Chronic kidney disease, stage 3 unspecified: Secondary | ICD-10-CM | POA: Diagnosis not present

## 2020-02-09 DIAGNOSIS — Z6838 Body mass index (BMI) 38.0-38.9, adult: Secondary | ICD-10-CM | POA: Diagnosis not present

## 2020-02-09 DIAGNOSIS — E1122 Type 2 diabetes mellitus with diabetic chronic kidney disease: Secondary | ICD-10-CM | POA: Diagnosis not present

## 2020-02-09 DIAGNOSIS — E1142 Type 2 diabetes mellitus with diabetic polyneuropathy: Secondary | ICD-10-CM | POA: Diagnosis not present

## 2020-02-09 DIAGNOSIS — I10 Essential (primary) hypertension: Secondary | ICD-10-CM | POA: Diagnosis not present

## 2020-02-09 DIAGNOSIS — M25561 Pain in right knee: Secondary | ICD-10-CM | POA: Diagnosis not present

## 2020-02-09 DIAGNOSIS — M79671 Pain in right foot: Secondary | ICD-10-CM | POA: Diagnosis not present

## 2020-02-09 DIAGNOSIS — M545 Low back pain: Secondary | ICD-10-CM | POA: Diagnosis not present

## 2020-02-09 DIAGNOSIS — E1165 Type 2 diabetes mellitus with hyperglycemia: Secondary | ICD-10-CM | POA: Diagnosis not present

## 2020-02-20 DIAGNOSIS — E7849 Other hyperlipidemia: Secondary | ICD-10-CM | POA: Diagnosis not present

## 2020-02-20 DIAGNOSIS — I10 Essential (primary) hypertension: Secondary | ICD-10-CM | POA: Diagnosis not present

## 2020-06-11 DIAGNOSIS — M25551 Pain in right hip: Secondary | ICD-10-CM | POA: Diagnosis not present

## 2020-06-11 DIAGNOSIS — W19XXXA Unspecified fall, initial encounter: Secondary | ICD-10-CM | POA: Diagnosis not present

## 2020-06-11 DIAGNOSIS — S8001XA Contusion of right knee, initial encounter: Secondary | ICD-10-CM | POA: Diagnosis not present

## 2020-06-11 DIAGNOSIS — M25561 Pain in right knee: Secondary | ICD-10-CM | POA: Diagnosis not present

## 2020-06-11 DIAGNOSIS — S79911A Unspecified injury of right hip, initial encounter: Secondary | ICD-10-CM | POA: Diagnosis not present

## 2020-06-24 DIAGNOSIS — M1711 Unilateral primary osteoarthritis, right knee: Secondary | ICD-10-CM | POA: Diagnosis not present

## 2020-06-24 DIAGNOSIS — Z6839 Body mass index (BMI) 39.0-39.9, adult: Secondary | ICD-10-CM | POA: Diagnosis not present

## 2020-06-24 DIAGNOSIS — M25461 Effusion, right knee: Secondary | ICD-10-CM | POA: Diagnosis not present

## 2020-06-24 DIAGNOSIS — M545 Low back pain: Secondary | ICD-10-CM | POA: Diagnosis not present

## 2020-06-24 DIAGNOSIS — E1122 Type 2 diabetes mellitus with diabetic chronic kidney disease: Secondary | ICD-10-CM | POA: Diagnosis not present

## 2020-07-30 ENCOUNTER — Emergency Department (HOSPITAL_COMMUNITY)
Admission: EM | Admit: 2020-07-30 | Discharge: 2020-07-30 | Disposition: A | Discharge status: Home / Self Care | Payer: Medicare HMO | Attending: Emergency Medicine | Admitting: Emergency Medicine

## 2020-07-30 ENCOUNTER — Emergency Department (HOSPITAL_COMMUNITY): Payer: Medicare HMO

## 2020-07-30 ENCOUNTER — Other Ambulatory Visit: Payer: Self-pay

## 2020-07-30 ENCOUNTER — Encounter (HOSPITAL_COMMUNITY): Payer: Self-pay | Admitting: Emergency Medicine

## 2020-07-30 DIAGNOSIS — Z9114 Patient's other noncompliance with medication regimen: Secondary | ICD-10-CM | POA: Insufficient documentation

## 2020-07-30 DIAGNOSIS — I10 Essential (primary) hypertension: Secondary | ICD-10-CM | POA: Diagnosis not present

## 2020-07-30 DIAGNOSIS — R0602 Shortness of breath: Secondary | ICD-10-CM | POA: Diagnosis not present

## 2020-07-30 DIAGNOSIS — E119 Type 2 diabetes mellitus without complications: Secondary | ICD-10-CM | POA: Diagnosis not present

## 2020-07-30 DIAGNOSIS — Z20822 Contact with and (suspected) exposure to covid-19: Secondary | ICD-10-CM | POA: Diagnosis not present

## 2020-07-30 DIAGNOSIS — R079 Chest pain, unspecified: Secondary | ICD-10-CM | POA: Diagnosis not present

## 2020-07-30 LAB — CBC WITH DIFFERENTIAL/PLATELET
Abs Immature Granulocytes: 0.02 10*3/uL (ref 0.00–0.07)
Basophils Absolute: 0 10*3/uL (ref 0.0–0.1)
Basophils Relative: 0 %
Eosinophils Absolute: 0.1 10*3/uL (ref 0.0–0.5)
Eosinophils Relative: 2 %
HCT: 34.7 % — ABNORMAL LOW (ref 36.0–46.0)
Hemoglobin: 10.7 g/dL — ABNORMAL LOW (ref 12.0–15.0)
Immature Granulocytes: 0 %
Lymphocytes Relative: 39 %
Lymphs Abs: 2.8 10*3/uL (ref 0.7–4.0)
MCH: 28 pg (ref 26.0–34.0)
MCHC: 30.8 g/dL (ref 30.0–36.0)
MCV: 90.8 fL (ref 80.0–100.0)
Monocytes Absolute: 0.4 10*3/uL (ref 0.1–1.0)
Monocytes Relative: 6 %
Neutro Abs: 3.7 10*3/uL (ref 1.7–7.7)
Neutrophils Relative %: 53 %
Platelets: 238 10*3/uL (ref 150–400)
RBC: 3.82 MIL/uL — ABNORMAL LOW (ref 3.87–5.11)
RDW: 16.5 % — ABNORMAL HIGH (ref 11.5–15.5)
WBC: 7.1 10*3/uL (ref 4.0–10.5)
nRBC: 0 % (ref 0.0–0.2)

## 2020-07-30 LAB — TROPONIN I (HIGH SENSITIVITY): Troponin I (High Sensitivity): 8 ng/L (ref <18)

## 2020-07-30 LAB — BASIC METABOLIC PANEL
Anion gap: 12 (ref 5–15)
BUN: 24 mg/dL — ABNORMAL HIGH (ref 8–23)
CO2: 21 mmol/L — ABNORMAL LOW (ref 22–32)
Calcium: 8.5 mg/dL — ABNORMAL LOW (ref 8.9–10.3)
Chloride: 109 mmol/L (ref 98–111)
Creatinine, Ser: 1.6 mg/dL — ABNORMAL HIGH (ref 0.44–1.00)
GFR calc Af Amer: 38 mL/min — ABNORMAL LOW (ref >60)
GFR calc non Af Amer: 33 mL/min — ABNORMAL LOW (ref >60)
Glucose, Bld: 90 mg/dL (ref 70–99)
Potassium: 4.3 mmol/L (ref 3.5–5.1)
Sodium: 142 mmol/L (ref 135–145)

## 2020-07-30 LAB — SARS CORONAVIRUS 2 BY RT PCR (HOSPITAL ORDER, PERFORMED IN ~~LOC~~ HOSPITAL LAB): SARS Coronavirus 2: NEGATIVE

## 2020-07-30 LAB — BRAIN NATRIURETIC PEPTIDE: B Natriuretic Peptide: 130 pg/mL — ABNORMAL HIGH (ref 0.0–100.0)

## 2020-07-30 MED ORDER — FUROSEMIDE 10 MG/ML IJ SOLN
20.0000 mg | Freq: Once | INTRAMUSCULAR | Status: AC
  Administered 2020-07-30: 20 mg via INTRAVENOUS
  Filled 2020-07-30: qty 2

## 2020-07-30 MED ORDER — DOXYCYCLINE HYCLATE 100 MG PO CAPS: 100.0000 mg | ORAL_CAPSULE | Freq: Two times a day (BID) | ORAL | 0 refills | Status: AC

## 2020-07-30 MED ORDER — DOXYCYCLINE HYCLATE 100 MG PO TABS
100.0000 mg | ORAL_TABLET | Freq: Once | ORAL | Status: AC
  Administered 2020-07-30: 100 mg via ORAL
  Filled 2020-07-30: qty 1

## 2020-07-30 MED ORDER — DICLOFENAC SODIUM 1 % EX GEL: 2.0000 g | Freq: Four times a day (QID) | CUTANEOUS | 0 refills | Status: AC | PRN

## 2020-07-30 NOTE — ED Notes (Signed)
Patient ambulated in hall without oxygen patient sats ranged from 96 to 98%. Patient states "I'm winded" upon return to room. Notified Dr. Laverta Baltimore
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
## 2020-07-30 NOTE — ED Triage Notes (Signed)
Pt c/o chest pain, shortness of breath, and back pain. Pt states she was at [facility] head earlier tonight but left without being seen.

## 2020-08-13 DIAGNOSIS — K219 Gastro-esophageal reflux disease without esophagitis: Secondary | ICD-10-CM | POA: Diagnosis not present

## 2020-08-13 DIAGNOSIS — G6289 Other specified polyneuropathies: Secondary | ICD-10-CM | POA: Diagnosis not present

## 2020-08-13 DIAGNOSIS — E039 Hypothyroidism, unspecified: Secondary | ICD-10-CM | POA: Diagnosis not present

## 2020-08-13 DIAGNOSIS — Z0001 Encounter for general adult medical examination with abnormal findings: Secondary | ICD-10-CM | POA: Diagnosis not present

## 2020-08-13 DIAGNOSIS — Z23 Encounter for immunization: Secondary | ICD-10-CM | POA: Diagnosis not present

## 2020-08-13 DIAGNOSIS — E1165 Type 2 diabetes mellitus with hyperglycemia: Secondary | ICD-10-CM | POA: Diagnosis not present

## 2020-08-13 DIAGNOSIS — J301 Allergic rhinitis due to pollen: Secondary | ICD-10-CM | POA: Diagnosis not present

## 2020-08-13 DIAGNOSIS — E1142 Type 2 diabetes mellitus with diabetic polyneuropathy: Secondary | ICD-10-CM | POA: Diagnosis not present

## 2020-08-13 DIAGNOSIS — E1122 Type 2 diabetes mellitus with diabetic chronic kidney disease: Secondary | ICD-10-CM | POA: Diagnosis not present

## 2020-08-13 DIAGNOSIS — I10 Essential (primary) hypertension: Secondary | ICD-10-CM | POA: Diagnosis not present

## 2020-08-13 DIAGNOSIS — Z1212 Encounter for screening for malignant neoplasm of rectum: Secondary | ICD-10-CM | POA: Diagnosis not present

## 2020-08-13 DIAGNOSIS — E782 Mixed hyperlipidemia: Secondary | ICD-10-CM | POA: Diagnosis not present

## 2020-09-19 DIAGNOSIS — R300 Dysuria: Secondary | ICD-10-CM | POA: Diagnosis not present

## 2020-09-19 DIAGNOSIS — M545 Low back pain, unspecified: Secondary | ICD-10-CM | POA: Diagnosis not present

## 2020-09-19 DIAGNOSIS — R1084 Generalized abdominal pain: Secondary | ICD-10-CM | POA: Diagnosis not present

## 2020-09-20 ENCOUNTER — Emergency Department (HOSPITAL_COMMUNITY): Payer: Medicare HMO

## 2020-09-20 ENCOUNTER — Encounter (HOSPITAL_COMMUNITY): Payer: Self-pay | Admitting: Emergency Medicine

## 2020-09-20 ENCOUNTER — Other Ambulatory Visit: Payer: Self-pay

## 2020-09-20 ENCOUNTER — Emergency Department (HOSPITAL_COMMUNITY)
Admission: EM | Admit: 2020-09-20 | Discharge: 2020-09-20 | Disposition: A | Discharge status: Home / Self Care | Payer: Medicare HMO | Attending: Emergency Medicine | Admitting: Emergency Medicine

## 2020-09-20 DIAGNOSIS — M545 Low back pain, unspecified: Secondary | ICD-10-CM | POA: Insufficient documentation

## 2020-09-20 DIAGNOSIS — E119 Type 2 diabetes mellitus without complications: Secondary | ICD-10-CM | POA: Insufficient documentation

## 2020-09-20 DIAGNOSIS — M5442 Lumbago with sciatica, left side: Secondary | ICD-10-CM | POA: Diagnosis not present

## 2020-09-20 DIAGNOSIS — I10 Essential (primary) hypertension: Secondary | ICD-10-CM | POA: Insufficient documentation

## 2020-09-20 DIAGNOSIS — M5441 Lumbago with sciatica, right side: Secondary | ICD-10-CM | POA: Diagnosis not present

## 2020-09-20 DIAGNOSIS — Z79899 Other long term (current) drug therapy: Secondary | ICD-10-CM | POA: Insufficient documentation

## 2020-09-20 DIAGNOSIS — Z7984 Long term (current) use of oral hypoglycemic drugs: Secondary | ICD-10-CM | POA: Diagnosis not present

## 2020-09-20 MED ORDER — HYDROCODONE-ACETAMINOPHEN 5-325 MG PO TABS: 1.0000 | ORAL_TABLET | Freq: Four times a day (QID) | ORAL | 0 refills | Status: DC | PRN

## 2020-09-20 MED ORDER — PREDNISONE 10 MG PO TABS: 20.0000 mg | ORAL_TABLET | Freq: Two times a day (BID) | ORAL | 0 refills | Status: DC

## 2020-09-20 MED ORDER — HYDROMORPHONE HCL 2 MG/ML IJ SOLN
2.0000 mg | Freq: Once | INTRAMUSCULAR | Status: AC
  Administered 2020-09-20: 2 mg via INTRAMUSCULAR
  Filled 2020-09-20: qty 1

## 2020-09-20 NOTE — ED Notes (Signed)
Pt awaiting re-eval and dispo

## 2020-09-20 NOTE — ED Notes (Signed)
Pt complains of back pain which makes her feel that she will fall   She has hx of hip and knee pain   Here for eval

## 2020-09-20 NOTE — ED Triage Notes (Addendum)
Pt reports lower back pain that radiates down both legs. Pt reports this started today. Pt states she is unable to ambulate without feeling like she is going to fall.

## 2020-09-20 NOTE — ED Notes (Signed)
From rad 

## 2020-09-20 NOTE — ED Notes (Signed)
Pt reports she was seen by Dr Olena Heckle this week, given back exercises and she has not started them   Pain 7/10

## 2020-09-20 NOTE — ED Provider Notes (Signed)
[facility] EMERGENCY DEPARTMENT Provider Note   CSN: [id number] Arrival date & time: 09/20/20  1610     History Chief Complaint  Patient presents with  . Leg Pain    [name] is a 69 y.o. female.  Patient is a 69 year old female with history of diabetes, hypertension, hyperlipidemia, anxiety, and bulging disc.  She presents today for evaluation of low back pain.  This started this morning when she woke from sleep.  She describes severe pain in her low back that radiates down both legs when she attempts to ambulate or move.  She denies any weakness or numbness.  She denies any bowel or bladder complaints.  She denies any injury or trauma.  The history is provided by the patient.       Past Medical History:  Diagnosis Date  . Anxiety   . Diabetes mellitus   . Hypercholesteremia   . Hypertension   . Noncompliance with medication regimen   . Thyroid disease     Patient Active Problem List   Diagnosis Date Noted  . DM 01/21/2010  . HYPERCHOLESTEROLEMIA 01/21/2010  . ANEMIA, IRON DEFICIENCY 01/21/2010  . HEMOCCULT POSITIVE STOOL 01/21/2010  . ANXIETY NEUROSIS 01/20/2010  . HYPERTENSION 01/20/2010  . GERD 01/20/2010  . DYSPHAGIA UNSPECIFIED 01/20/2010    Past Surgical History:  Procedure Laterality Date  . ABDOMINAL HYSTERECTOMY    . BREAST SURGERY    . c-sections       OB History    Gravida  2   Para  2   Term  2   Preterm      AB      Living  2     SAB      TAB      Ectopic      Multiple      Live Births              No family history on file.  Social History   Tobacco Use  . Smoking status: Never Smoker  . Smokeless tobacco: Never Used  Substance Use Topics  . Alcohol use: No  . Drug use: No    Home Medications Prior to Admission medications   Medication Sig Start Date End Date Taking? Authorizing Provider  Aspirin-Salicylamide-Caffeine (BC HEADACHE) 325-95-16 MG TABS Take 1 packet by mouth daily as needed (for  pain).    [provider]  atenolol (TENORMIN) 25 MG tablet Take 1 tablet (25 mg total) by mouth daily. 12/24/15   Francine Graven, DO  diclofenac Sodium (VOLTAREN) 1 % GEL Apply 2 g topically 4 (four) times daily as needed. 07/30/20   Long, [facility] Old[facility], MD  ferrous sulfate 325 (65 FE) MG tablet Take 325 mg by mouth daily.    [provider]  fish oil-omega-3 fatty acids 1000 MG capsule Take 1 g by mouth daily.    [provider]  fluticasone (FLONASE) 50 MCG/ACT nasal spray Place 2 sprays into the nose daily.  07/03/13   [provider]  gabapentin (NEURONTIN) 100 MG capsule Take 100 mg by mouth 3 (three) times daily. For NEUROPATHY    [provider]  hydrochlorothiazide (HYDRODIURIL) 25 MG tablet Take 1 tablet (25 mg total) by mouth daily. 10/14/15   Lily Kocher, PA-C  levothyroxine (LEVOTHROID) 25 MCG tablet Take 1 tablet (25 mcg total) by mouth daily before breakfast. 10/14/15   Lily Kocher, PA-C  lisinopril-hydrochlorothiazide (PRINZIDE,ZESTORETIC) 20-25 MG tablet Take 1 tablet by mouth daily. 10/14/15  Lily Kocher, PA-C  loratadine (CLARITIN) 10 MG tablet Take 10 mg by mouth daily. For ALLERGIES    [provider]  metFORMIN (GLUCOPHAGE) 1000 MG tablet Take 1 tablet (1,000 mg total) by mouth 2 (two) times daily with a meal. 10/14/15   Lily Kocher, PA-C  pravastatin (PRAVACHOL) 20 MG tablet Take 20 mg by mouth daily. For CHOLESTEROL    [provider]  ranitidine (ZANTAC) 300 MG tablet Take 300 mg by mouth daily as needed for heartburn.  01/01/14   [provider]  traMADol (ULTRAM) 50 MG tablet Take 1 tablet (50 mg total) by mouth every 6 (six) hours as needed. 07/11/17   Marybelle Killings, MD  vitamin B-12 (CYANOCOBALAMIN) 1000 MCG tablet Take 1,000 mcg by mouth daily.    [provider]  vitamin C (ASCORBIC ACID) 500 MG tablet Take 500 mg by mouth daily.    [provider]    Allergies      Sulfonamide derivatives  Review of Systems   Review of Systems  All other systems reviewed and are negative.   Physical Exam Updated Vital Signs There were no vitals taken for this visit.  Physical Exam Vitals and nursing note reviewed.  Constitutional:      General: She is not in acute distress.    Appearance: She is well-developed. She is not diaphoretic.  HENT:     Head: Normocephalic and atraumatic.  Cardiovascular:     Rate and Rhythm: Normal rate and regular rhythm.     Heart sounds: No murmur heard.  No friction rub. No gallop.   Pulmonary:     Effort: Pulmonary effort is normal. No respiratory distress.     Breath sounds: Normal breath sounds. No wheezing.  Abdominal:     General: Bowel sounds are normal. There is no distension.     Palpations: Abdomen is soft.     Tenderness: There is no abdominal tenderness.  Musculoskeletal:        General: Normal range of motion.     Cervical back: Normal range of motion and neck supple.     Comments: There is tenderness to palpation of the soft tissues of the lumbar region.  Skin:    General: Skin is warm and dry.  Neurological:     Mental Status: She is alert and oriented to person, place, and time.     Comments: DTRs are 1+ and symmetrical in the patellar and Achilles tendons bilaterally.  Strength is 5 out of 5 in both lower extremities.     ED Results / Procedures / Treatments   Labs (all labs ordered are listed, but only abnormal results are displayed) Labs Reviewed - No data to display  EKG None  Radiology No results found.  Procedures Procedures (including critical care time)  Medications Ordered in ED Medications  HYDROmorphone (DILAUDID) injection 2 mg (has no administration in time range)    ED Course  I have reviewed the triage vital signs and the nursing notes.  Pertinent labs & imaging results that were available during my care of the patient were reviewed by me and considered in my medical  decision making (see chart for details).    MDM Rules/Calculators/A&P  Patient presenting here with complaints of back pain as described in the HPI.  There are no red flags today that would suggest an emergent situation.  Her strength and reflexes are normal and symmetrical and there are no bowel or bladder issues.  Patient given IM  Dilaudid with good relief.  She has no emergent findings on her CT scan, but does have significant degenerative changes.  Patient will be treated with prednisone and pain medicine.  She is to follow-up with her primary doctor if not improving in the next week.  Final Clinical Impression(s) / ED Diagnoses Final diagnoses:  None    Rx / DC Orders ED Discharge Orders    None       Veryl Speak, MD 09/20/20 1810

## 2020-09-20 NOTE — Discharge Instructions (Addendum)
Begin taking prednisone as prescribed.  Begin taking hydrocodone as prescribed as needed for pain.  Follow-up with your primary doctor if your symptoms or not improving in the next week, and return to the ER if symptoms significantly worsen or change.

## 2020-09-20 NOTE — ED Notes (Signed)
Pt has been discharged-   Unable to take from system as someone else is using chart

## 2020-09-20 NOTE — ED Notes (Signed)
Signature pad did not work as pt was trying to sign

## 2020-10-01 ENCOUNTER — Encounter (HOSPITAL_COMMUNITY): Payer: Self-pay

## 2020-10-01 ENCOUNTER — Other Ambulatory Visit: Payer: Self-pay

## 2020-10-01 DIAGNOSIS — Z7984 Long term (current) use of oral hypoglycemic drugs: Secondary | ICD-10-CM | POA: Diagnosis not present

## 2020-10-01 DIAGNOSIS — M4310 Spondylolisthesis, site unspecified: Secondary | ICD-10-CM | POA: Diagnosis not present

## 2020-10-01 DIAGNOSIS — M48061 Spinal stenosis, lumbar region without neurogenic claudication: Secondary | ICD-10-CM | POA: Insufficient documentation

## 2020-10-01 DIAGNOSIS — I10 Essential (primary) hypertension: Secondary | ICD-10-CM | POA: Insufficient documentation

## 2020-10-01 DIAGNOSIS — E119 Type 2 diabetes mellitus without complications: Secondary | ICD-10-CM | POA: Insufficient documentation

## 2020-10-01 DIAGNOSIS — M545 Low back pain, unspecified: Secondary | ICD-10-CM | POA: Diagnosis present

## 2020-10-01 DIAGNOSIS — Z79899 Other long term (current) drug therapy: Secondary | ICD-10-CM | POA: Insufficient documentation

## 2020-10-01 DIAGNOSIS — R300 Dysuria: Secondary | ICD-10-CM | POA: Diagnosis not present

## 2020-10-01 NOTE — ED Triage Notes (Signed)
Pt pov from home for back and bilat leg pain. Here 10/31 for same. Was told she has bulging discs and needed surgery down the road.

## 2020-10-02 ENCOUNTER — Emergency Department (HOSPITAL_COMMUNITY)
Admission: EM | Admit: 2020-10-02 | Discharge: 2020-10-02 | Disposition: A | Discharge status: Home / Self Care | Payer: Medicare HMO | Attending: Emergency Medicine | Admitting: Emergency Medicine

## 2020-10-02 DIAGNOSIS — M48061 Spinal stenosis, lumbar region without neurogenic claudication: Secondary | ICD-10-CM

## 2020-10-02 LAB — URINALYSIS, ROUTINE W REFLEX MICROSCOPIC
Bacteria, UA: NONE SEEN
Bilirubin Urine: NEGATIVE
Glucose, UA: NEGATIVE mg/dL
Hgb urine dipstick: NEGATIVE
Ketones, ur: NEGATIVE mg/dL
Leukocytes,Ua: NEGATIVE
Nitrite: NEGATIVE
Protein, ur: 30 mg/dL — AB
Specific Gravity, Urine: 1.026 (ref 1.005–1.030)
pH: 5 (ref 5.0–8.0)

## 2020-10-02 MED ORDER — METHOCARBAMOL 500 MG PO TABS: 500.0000 mg | ORAL_TABLET | Freq: Two times a day (BID) | ORAL | 0 refills | Status: DC

## 2020-10-02 MED ORDER — METHOCARBAMOL 500 MG PO TABS
500.0000 mg | ORAL_TABLET | Freq: Once | ORAL | Status: AC
  Administered 2020-10-02: 500 mg via ORAL
  Filled 2020-10-02: qty 1

## 2020-10-02 MED ORDER — KETOROLAC TROMETHAMINE 30 MG/ML IJ SOLN
30.0000 mg | Freq: Once | INTRAMUSCULAR | Status: AC
  Administered 2020-10-02: 30 mg via INTRAMUSCULAR
  Filled 2020-10-02: qty 1

## 2020-10-02 NOTE — ED Notes (Signed)
Patient able to ambulate independently and without gait disturbance to bathroom using cane.

## 2020-10-02 NOTE — Discharge Instructions (Addendum)

## 2020-10-02 NOTE — ED Provider Notes (Signed)
[facility] EMERGENCY DEPARTMENT Provider Note   CSN: [id number] Arrival date & time: 10/01/20  1958     History Chief Complaint  Patient presents with  . Back Pain  . Leg Pain    [name] is a 69 y.o. female.  The history is provided by the patient.  Back Pain Location:  Lumbar spine Quality:  Aching Radiates to:  L thigh and R thigh Pain severity:  Moderate Onset quality:  Gradual Duration:  2 weeks Timing:  Constant Progression:  Worsening Chronicity:  Recurrent Relieved by:  Nothing Exacerbated by: walking. Associated symptoms: dysuria and leg pain   Associated symptoms: no abdominal pain, no bladder incontinence, no bowel incontinence, no fever and no weakness   Risk factors: no recent surgery   Leg Pain Associated symptoms: back pain   Associated symptoms: no fever   Patient with history of anxiety, diabetes, hypertension presents with low back pain.  Patient reports is been ongoing for at least 2 weeks.  It starts in her low back and goes into both legs.  No incontinence. No previous back surgery.  No trauma.  She is already been seen once for this in the ER & reports her PCP is considering doing an MRI     Past Medical History:  Diagnosis Date  . Anxiety   . Diabetes mellitus   . Hypercholesteremia   . Hypertension   . Noncompliance with medication regimen   . Thyroid disease     Patient Active Problem List   Diagnosis Date Noted  . DM 01/21/2010  . HYPERCHOLESTEROLEMIA 01/21/2010  . ANEMIA, IRON DEFICIENCY 01/21/2010  . HEMOCCULT POSITIVE STOOL 01/21/2010  . ANXIETY NEUROSIS 01/20/2010  . HYPERTENSION 01/20/2010  . GERD 01/20/2010  . DYSPHAGIA UNSPECIFIED 01/20/2010    Past Surgical History:  Procedure Laterality Date  . ABDOMINAL HYSTERECTOMY    . BREAST SURGERY    . c-sections       OB History    Gravida  2   Para  2   Term  2   Preterm      AB      Living  2     SAB      TAB      Ectopic      Multiple        Live Births              History reviewed. No pertinent family history.  Social History   Tobacco Use  . Smoking status: Never Smoker  . Smokeless tobacco: Never Used  Substance Use Topics  . Alcohol use: No  . Drug use: No    Home Medications Prior to Admission medications   Medication Sig Start Date End Date Taking? Authorizing Provider  Aspirin-Salicylamide-Caffeine (BC HEADACHE) 325-95-16 MG TABS Take 1 packet by mouth daily as needed (for pain).    [provider]  atenolol (TENORMIN) 25 MG tablet Take 1 tablet (25 mg total) by mouth daily. 12/24/15   Francine Graven, DO  diclofenac Sodium (VOLTAREN) 1 % GEL Apply 2 g topically 4 (four) times daily as needed. 07/30/20   Long, Wonda Olds, MD  ferrous sulfate 325 (65 FE) MG tablet Take 325 mg by mouth daily.    [provider]  fish oil-omega-3 fatty acids 1000 MG capsule Take 1 g by mouth daily.    [provider]  fluticasone (FLONASE) 50 MCG/ACT nasal spray Place 2 sprays into the nose daily.  07/03/13   [provider]  gabapentin (NEURONTIN) 100 MG capsule Take 100 mg by mouth 3 (three) times daily. For NEUROPATHY    [provider]  hydrochlorothiazide (HYDRODIURIL) 25 MG tablet Take 1 tablet (25 mg total) by mouth daily. 10/14/15   Lily Kocher, PA-C  levothyroxine (LEVOTHROID) 25 MCG tablet Take 1 tablet (25 mcg total) by mouth daily before breakfast. 10/14/15   Lily Kocher, PA-C  lisinopril-hydrochlorothiazide (PRINZIDE,ZESTORETIC) 20-25 MG tablet Take 1 tablet by mouth daily. 10/14/15   Lily Kocher, PA-C  loratadine (CLARITIN) 10 MG tablet Take 10 mg by mouth daily. For ALLERGIES    [provider]  metFORMIN (GLUCOPHAGE) 1000 MG tablet Take 1 tablet (1,000 mg total) by mouth 2 (two) times daily with a meal. 10/14/15   Lily Kocher, PA-C  methocarbamol (ROBAXIN) 500 MG tablet Take 1 tablet (500 mg total) by mouth 2 (two) times daily. 10/02/20   Ripley Fraise, MD  pravastatin (PRAVACHOL) 20 MG tablet Take 20 mg by mouth daily. For CHOLESTEROL    [provider]  ranitidine (ZANTAC) 300 MG tablet Take 300 mg by mouth daily as needed for heartburn.  01/01/14   [provider]  vitamin B-12 (CYANOCOBALAMIN) 1000 MCG tablet Take 1,000 mcg by mouth daily.    [provider]  vitamin C (ASCORBIC ACID) 500 MG tablet Take 500 mg by mouth daily.    [provider]    Allergies    Sulfonamide derivatives  Review of Systems   Review of Systems  Constitutional: Negative for fever.  Gastrointestinal: Negative for abdominal pain and bowel incontinence.  Genitourinary: Positive for dysuria. Negative for bladder incontinence.  Musculoskeletal: Positive for back pain.  Neurological: Negative for weakness.  All other systems reviewed and are negative.   Physical Exam Updated Vital Signs BP (!) 191/79 (BP Location: Left Arm)   Pulse 60   Temp 98.1 F (36.7 C) (Oral)   Resp 16   Ht 1.448 m (4\' 9" )   Wt 78.9 kg   SpO2 100%   BMI 37.64 kg/m   Physical Exam CONSTITUTIONAL: Well developed/well nourished HEAD: Normocephalic/atraumatic EYES: EOMI/PERRL ENMT: Mucous membranes moist NECK: supple no meningeal signs SPINE/BACK: Lumbar spinal & paraspinal tenderness, no bruising/crepitance/stepoffs noted to spine CV: S1/S2 noted, no murmurs/rubs/gallops noted LUNGS: Lungs are clear to auscultation bilaterally, no apparent distress ABDOMEN: soft, nontender, no rebound or guarding GU:no cva tenderness NEURO: Awake/alert, equal motor 5/5 strength noted with the following: hip flexion/knee flexion/extension, foot dorsi/plantar flexion, great toe extension intact bilaterally, no clonus bilaterally, Pt is able to ambulate unassisted. EXTREMITIES: pulses normal, full ROM SKIN: warm, color normal PSYCH: no abnormalities of mood noted, alert and oriented to situation   ED Results / Procedures / Treatments   Labs (all  labs ordered are listed, but only abnormal results are displayed) Labs Reviewed  URINALYSIS, ROUTINE W REFLEX MICROSCOPIC - Abnormal; Notable for the following components:      Result Value   Protein, ur 30 (*)    All other components within normal limits    EKG None  Radiology No results found.  Procedures Procedures    Medications Ordered in ED Medications  methocarbamol (ROBAXIN) tablet 500 mg (500 mg Oral Given 10/02/20 0324)  ketorolac (TORADOL) 30 MG/ML injection 30 mg (30 mg Intramuscular Given 10/02/20 0323)    ED Course  I have reviewed the triage vital signs and the nursing notes.  Pertinent labs  results that were available during my care of the patient were reviewed  by me and considered in my medical decision making (see chart for details).    MDM Rules/Calculators/A&P                          Patient presents for continued back pain.  She had a CT done last month that revealed spinal stenosis.  She is already been prescribed pain medications.  She has also been prescribed steroids previously. At this point there are no acute neurologic deficits noted She is ambulatory.  We will add on a muscle relaxant. She had some improvement with medications here in the ER.  No indication for emergent MRI.  She will be referred to neurosurgery.  Also advised close follow-up with PCP to have her MRI done as an outpatient.  We discussed return precautions  Final Clinical Impression(s) / ED Diagnoses Final diagnoses:  Spinal stenosis of lumbar region without neurogenic claudication    Rx / DC Orders ED Discharge Orders         Ordered    methocarbamol (ROBAXIN) 500 MG tablet  2 times daily        10/02/20 0408           Ripley Fraise, MD 10/02/20 [phone]

## 2020-10-02 NOTE — ED Notes (Signed)
Patient discharged home to caregiver.  Wheelchair out of ED.  All discharge instructions reviewed.  Patient able to demonstrate knowledge via teachback method.

## 2020-10-19 ENCOUNTER — Emergency Department (HOSPITAL_COMMUNITY)
Admission: EM | Admit: 2020-10-19 | Discharge: 2020-10-19 | Disposition: A | Discharge status: Home / Self Care | Payer: Medicare HMO | Attending: Emergency Medicine | Admitting: Emergency Medicine

## 2020-10-19 ENCOUNTER — Emergency Department (HOSPITAL_COMMUNITY): Payer: Medicare HMO

## 2020-10-19 ENCOUNTER — Encounter (HOSPITAL_COMMUNITY): Payer: Self-pay

## 2020-10-19 ENCOUNTER — Other Ambulatory Visit: Payer: Self-pay

## 2020-10-19 DIAGNOSIS — Z79899 Other long term (current) drug therapy: Secondary | ICD-10-CM | POA: Diagnosis not present

## 2020-10-19 DIAGNOSIS — W19XXXA Unspecified fall, initial encounter: Secondary | ICD-10-CM

## 2020-10-19 DIAGNOSIS — Z7982 Long term (current) use of aspirin: Secondary | ICD-10-CM | POA: Insufficient documentation

## 2020-10-19 DIAGNOSIS — M5441 Lumbago with sciatica, right side: Secondary | ICD-10-CM

## 2020-10-19 DIAGNOSIS — M545 Low back pain, unspecified: Secondary | ICD-10-CM | POA: Diagnosis not present

## 2020-10-19 DIAGNOSIS — I10 Essential (primary) hypertension: Secondary | ICD-10-CM | POA: Diagnosis not present

## 2020-10-19 DIAGNOSIS — Z7984 Long term (current) use of oral hypoglycemic drugs: Secondary | ICD-10-CM | POA: Insufficient documentation

## 2020-10-19 DIAGNOSIS — Z043 Encounter for examination and observation following other accident: Secondary | ICD-10-CM | POA: Diagnosis not present

## 2020-10-19 DIAGNOSIS — M5442 Lumbago with sciatica, left side: Secondary | ICD-10-CM | POA: Diagnosis not present

## 2020-10-19 DIAGNOSIS — E119 Type 2 diabetes mellitus without complications: Secondary | ICD-10-CM | POA: Diagnosis not present

## 2020-10-19 DIAGNOSIS — M79605 Pain in left leg: Secondary | ICD-10-CM | POA: Insufficient documentation

## 2020-10-19 DIAGNOSIS — M79604 Pain in right leg: Secondary | ICD-10-CM | POA: Insufficient documentation

## 2020-10-19 DIAGNOSIS — W108XXA Fall (on) (from) other stairs and steps, initial encounter: Secondary | ICD-10-CM | POA: Diagnosis not present

## 2020-10-19 MED ORDER — TRAMADOL HCL 50 MG PO TABS
50.0000 mg | ORAL_TABLET | Freq: Once | ORAL | Status: AC
  Administered 2020-10-19: 50 mg via ORAL
  Filled 2020-10-19: qty 1

## 2020-10-19 MED ORDER — METHOCARBAMOL 500 MG PO TABS: 500.0000 mg | ORAL_TABLET | Freq: Two times a day (BID) | ORAL | 0 refills | Status: DC

## 2020-10-19 MED ORDER — ONDANSETRON 4 MG PO TBDP
4.0000 mg | ORAL_TABLET | Freq: Once | ORAL | Status: AC
  Administered 2020-10-19: 4 mg via ORAL
  Filled 2020-10-19: qty 1

## 2020-10-19 MED ORDER — MELOXICAM 7.5 MG PO TABS: 7.5000 mg | ORAL_TABLET | Freq: Every day | ORAL | 0 refills | Status: DC

## 2020-10-19 MED ORDER — HYDROCODONE-ACETAMINOPHEN 5-325 MG PO TABS: 1.0000 | ORAL_TABLET | Freq: Four times a day (QID) | ORAL | 0 refills | Status: DC | PRN

## 2020-10-19 MED ORDER — HYDROMORPHONE HCL 1 MG/ML IJ SOLN
1.0000 mg | Freq: Once | INTRAMUSCULAR | Status: AC
  Administered 2020-10-19: 1 mg via INTRAMUSCULAR
  Filled 2020-10-19: qty 1

## 2020-10-19 NOTE — ED Notes (Signed)
Pt ambulated to BR and back to bed using her cane.  Pt with difficultly ambulating due to pain

## 2020-10-19 NOTE — ED Triage Notes (Signed)
Pt to er, pt states that she tripped on her shoes Friday coming out the front door and fell backwards on three steps, pt denies loc, pt c/o back and leg pain, pt able to get out of the car and into a wheel chair without assistance.

## 2020-10-19 NOTE — Discharge Instructions (Addendum)
Do not drive within 4 hours of taking hydrocodone as this will make you drowsy.  Avoid lifting,  Bending,  Twisting or any other activity that worsens your pain over the next week.  Apply a heating pad to your lower back 20 minutes 3-4 times daily.  You should get rechecked if your symptoms are not better over the next 5 days,  Or you develop increased pain,  Weakness in your leg(s) or loss of bladder or bowel function - these are symptoms of a worsening injury.  As discussed, your xrays tonight are reassuring.

## 2020-10-19 NOTE — ED Provider Notes (Signed)
[facility] EMERGENCY DEPARTMENT Provider Note   CSN: [id number] Arrival date & time: 10/19/20  1643     History Chief Complaint  Patient presents with  . Fall    [name] is a 69 y.o. female with a history of diabetes, hypertension, thyroid disease presenting for evaluation of low back pain since tripping and falling down 3 steps 2 days ago.  She had her back to the steps as she was trying to close her front door and tripped over her shoes falling backwards and tumbled down the steps.  She denies hitting her head.  She endorses midline low back pain with radiation down her bilateral posterior legs consistent with prior episodes of sciatica.  She denies weakness or numbness in her legs.  Her pain is worse with movement and certain positions.  She has been able to ambulate but with increased pain.  She took Tylenol yesterday without any improvement in her symptoms, she has had no other medications nor has she found any other alleviators.  HPI     Past Medical History:  Diagnosis Date  . Anxiety   . Diabetes mellitus   . Hypercholesteremia   . Hypertension   . Noncompliance with medication regimen   . Thyroid disease     Patient Active Problem List   Diagnosis Date Noted  . DM 01/21/2010  . HYPERCHOLESTEROLEMIA 01/21/2010  . ANEMIA, IRON DEFICIENCY 01/21/2010  . HEMOCCULT POSITIVE STOOL 01/21/2010  . ANXIETY NEUROSIS 01/20/2010  . HYPERTENSION 01/20/2010  . GERD 01/20/2010  . DYSPHAGIA UNSPECIFIED 01/20/2010    Past Surgical History:  Procedure Laterality Date  . ABDOMINAL HYSTERECTOMY    . BREAST SURGERY    . c-sections       OB History    Gravida  2   Para  2   Term  2   Preterm      AB      Living  2     SAB      TAB      Ectopic      Multiple      Live Births              History reviewed. No pertinent family history.  Social History   Tobacco Use  . Smoking status: Never Smoker  . Smokeless tobacco: Never Used  Vaping  Use  . Vaping Use: Never used  Substance Use Topics  . Alcohol use: No  . Drug use: No    Home Medications Prior to Admission medications   Medication Sig Start Date End Date Taking? Authorizing Provider  Aspirin-Salicylamide-Caffeine (BC HEADACHE) 325-95-16 MG TABS Take 1 packet by mouth daily as needed (for pain).    [provider]  atenolol (TENORMIN) 25 MG tablet Take 1 tablet (25 mg total) by mouth daily. 12/24/15   Francine Graven, DO  diclofenac Sodium (VOLTAREN) 1 % GEL Apply 2 g topically 4 (four) times daily as needed. 07/30/20   Long, Wonda Olds, MD  ferrous sulfate 325 (65 FE) MG tablet Take 325 mg by mouth daily.    [provider]  fish oil-omega-3 fatty acids 1000 MG capsule Take 1 g by mouth daily.    [provider]  fluticasone (FLONASE) 50 MCG/ACT nasal spray Place 2 sprays into the nose daily.  07/03/13   [provider]  gabapentin (NEURONTIN) 100 MG capsule Take 100 mg by mouth 3 (three) times daily. For NEUROPATHY    [provider]  hydrochlorothiazide (HYDRODIURIL)  25 MG tablet Take 1 tablet (25 mg total) by mouth daily. 10/14/15   Lily Kocher, PA-C  HYDROcodone-acetaminophen (NORCO/VICODIN) 5-325 MG tablet Take 1 tablet by mouth every 6 (six) hours as needed. 10/19/20   Evalee Jefferson, PA-C  levothyroxine (LEVOTHROID) 25 MCG tablet Take 1 tablet (25 mcg total) by mouth daily before breakfast. 10/14/15   Lily Kocher, PA-C  lisinopril-hydrochlorothiazide (PRINZIDE,ZESTORETIC) 20-25 MG tablet Take 1 tablet by mouth daily. 10/14/15   Lily Kocher, PA-C  loratadine (CLARITIN) 10 MG tablet Take 10 mg by mouth daily. For ALLERGIES    [provider]  meloxicam (MOBIC) 7.5 MG tablet Take 1 tablet (7.5 mg total) by mouth daily. 10/19/20   Evalee Jefferson, PA-C  metFORMIN (GLUCOPHAGE) 1000 MG tablet Take 1 tablet (1,000 mg total) by mouth 2 (two) times daily with a meal. 10/14/15   Lily Kocher, PA-C  methocarbamol (ROBAXIN)  500 MG tablet Take 1 tablet (500 mg total) by mouth 2 (two) times daily. 10/19/20   Evalee Jefferson, PA-C  pravastatin (PRAVACHOL) 20 MG tablet Take 20 mg by mouth daily. For CHOLESTEROL    [provider]  ranitidine (ZANTAC) 300 MG tablet Take 300 mg by mouth daily as needed for heartburn.  01/01/14   [provider]  vitamin B-12 (CYANOCOBALAMIN) 1000 MCG tablet Take 1,000 mcg by mouth daily.    [provider]  vitamin C (ASCORBIC ACID) 500 MG tablet Take 500 mg by mouth daily.    [provider]    Allergies    Sulfonamide derivatives  Review of Systems   Review of Systems  Constitutional: Negative for chills and fever.  HENT: Negative.   Respiratory: Negative.   Cardiovascular: Negative.   Gastrointestinal: Negative.   Musculoskeletal: Positive for arthralgias and back pain. Negative for joint swelling, myalgias and neck pain.  Neurological: Negative for weakness, numbness and headaches.  All other systems reviewed and are negative.   Physical Exam Updated Vital Signs BP (!) 146/59 (BP Location: Left Arm)   Pulse 65   Temp 98.5 F (36.9 C) (Oral)   Resp 16   Ht 4\' 9"  (1.448 m)   Wt 78.9 kg   SpO2 99%   BMI 37.65 kg/m   Physical Exam Vitals and nursing note reviewed.  Constitutional:      Appearance: She is well-developed.  HENT:     Head: Normocephalic and atraumatic.  Eyes:     Extraocular Movements: Extraocular movements intact.     Conjunctiva/sclera: Conjunctivae normal.     Pupils: Pupils are equal, round, and reactive to light.  Cardiovascular:     Rate and Rhythm: Normal rate.     Comments: Pedal pulses normal. Pulmonary:     Effort: Pulmonary effort is normal.  Abdominal:     General: Bowel sounds are normal. There is no distension.     Palpations: Abdomen is soft. There is no mass.  Musculoskeletal:        General: Normal range of motion.     Cervical back: Normal range of motion and neck supple.     Lumbar back:  Tenderness present. No swelling, edema or spasms.     Comments: Tender to palpation midline lower lumbar region.  No palpable deformity.  She is also tender at her left SI joint.  There is no edema or ecchymosis present.  Skin:    General: Skin is warm and dry.  Neurological:     Mental Status: She is alert.     Sensory:  No sensory deficit.     Motor: No tremor or atrophy.     Gait: Gait normal.     Deep Tendon Reflexes:     Reflex Scores:      Patellar reflexes are 2+ on the right side and 2+ on the left side.      Achilles reflexes are 2+ on the right side and 2+ on the left side.    Comments: No strength deficit noted in hip and knee flexor and extensor muscle groups.  Ankle flexion and extension intact.     ED Results / Procedures / Treatments   Labs (all labs ordered are listed, but only abnormal results are displayed) Labs Reviewed - No data to display  EKG None  Radiology DG Lumbar Spine Complete  Result Date: 10/19/2020 CLINICAL DATA:  Status post fall EXAM: LUMBAR SPINE - COMPLETE 4+ VIEW COMPARISON:  CT lumbar spine 09/20/2020. chest x-ray 07/30/2020 FINDINGS: Five non-rib-bearing lumbar vertebral bodies. Multilevel, at least moderate in severity, degenerative changes of the visualized portions of the thoracolumbar spine. There is no evidence of lumbar spine fracture. Stable grade 1 anterolisthesis of L4 on L5. Otherwise alignment is normal. Intervertebral disc spaces are maintained at the lumbar levels. Aortic atherosclerotic plaque. IMPRESSION: No acute displaced fracture or traumatic listhesis of the lumbar spine. Electronically Signed   By: Iven Finn M.D.   On: 10/19/2020 19:30   DG Hips Bilat W or Wo Pelvis 2 Views  Result Date: 10/19/2020 CLINICAL DATA:  Status post fall. EXAM: DG HIP (WITH OR WITHOUT PELVIS) 2V BILAT COMPARISON:  None. FINDINGS: There is no evidence of hip fracture or dislocation. There is no evidence of severe arthropathy or other focal bone  abnormality. Atherosclerotic plaque. IMPRESSION: No acute displaced fracture or dislocation. Electronically Signed   By: Iven Finn M.D.   On: 10/19/2020 19:31    Procedures Procedures (including critical care time)  Medications Ordered in ED Medications  traMADol (ULTRAM) tablet 50 mg (50 mg Oral Given 10/19/20 1846)  HYDROmorphone (DILAUDID) injection 1 mg (1 mg Intramuscular Given 10/19/20 2014)  ondansetron (ZOFRAN-ODT) disintegrating tablet 4 mg (4 mg Oral Given 10/19/20 2013)    ED Course  I have reviewed the triage vital signs and the nursing notes.  Pertinent labs & imaging results that were available during my care of the patient were reviewed by me and considered in my medical decision making (see chart for details).    MDM Rules/Calculators/A&P                         Pts imaging reviewed and discussed with pt.  No acute findings.  She was given tramadol tablet with no relief of pain.  Significant discomfort with ambulation to restroom.  Dilaudid 1 mg IM given.  Pt is out of her robaxin which has been helpful in the past, will refill this medicine. Also added meloxicam, few hydrocodone given after review of [place] narc database.    No neuro deficit on exam or by history to suggest emergent or surgical presentation.  Discussed worsened sx that should prompt immediate re-evaluation including distal weakness, bowel/bladder retention/incontinence.       Final Clinical Impression(s) / ED Diagnoses Final diagnoses:  Fall, initial encounter  Acute bilateral low back pain with bilateral sciatica    Rx / DC Orders ED Discharge Orders         Ordered    methocarbamol (ROBAXIN) 500 MG tablet  2 times daily  10/19/20 2024    HYDROcodone-acetaminophen (NORCO/VICODIN) 5-325 MG tablet  Every 6 hours PRN        10/19/20 2024    meloxicam (MOBIC) 7.5 MG tablet  Daily        10/19/20 2024           Landis Martins 10/19/20 2026    Noemi Chapel, MD 10/20/20  1600

## 2020-10-28 DIAGNOSIS — M6281 Muscle weakness (generalized): Secondary | ICD-10-CM | POA: Diagnosis not present

## 2020-10-28 DIAGNOSIS — R262 Difficulty in walking, not elsewhere classified: Secondary | ICD-10-CM | POA: Diagnosis not present

## 2020-10-28 DIAGNOSIS — M5136 Other intervertebral disc degeneration, lumbar region: Secondary | ICD-10-CM | POA: Diagnosis not present

## 2020-10-28 DIAGNOSIS — M4310 Spondylolisthesis, site unspecified: Secondary | ICD-10-CM | POA: Diagnosis not present

## 2020-11-03 DIAGNOSIS — M4310 Spondylolisthesis, site unspecified: Secondary | ICD-10-CM | POA: Diagnosis not present

## 2020-11-03 DIAGNOSIS — M6281 Muscle weakness (generalized): Secondary | ICD-10-CM | POA: Diagnosis not present

## 2020-11-03 DIAGNOSIS — R262 Difficulty in walking, not elsewhere classified: Secondary | ICD-10-CM | POA: Diagnosis not present

## 2020-11-03 DIAGNOSIS — M5136 Other intervertebral disc degeneration, lumbar region: Secondary | ICD-10-CM | POA: Diagnosis not present

## 2020-11-06 DIAGNOSIS — M4310 Spondylolisthesis, site unspecified: Secondary | ICD-10-CM | POA: Diagnosis not present

## 2020-11-06 DIAGNOSIS — M5136 Other intervertebral disc degeneration, lumbar region: Secondary | ICD-10-CM | POA: Diagnosis not present

## 2020-11-06 DIAGNOSIS — R262 Difficulty in walking, not elsewhere classified: Secondary | ICD-10-CM | POA: Diagnosis not present

## 2020-11-06 DIAGNOSIS — M6281 Muscle weakness (generalized): Secondary | ICD-10-CM | POA: Diagnosis not present

## 2020-11-07 DIAGNOSIS — M1711 Unilateral primary osteoarthritis, right knee: Secondary | ICD-10-CM | POA: Diagnosis not present

## 2020-11-07 DIAGNOSIS — Z9181 History of falling: Secondary | ICD-10-CM | POA: Diagnosis not present

## 2020-11-10 DIAGNOSIS — M6281 Muscle weakness (generalized): Secondary | ICD-10-CM | POA: Diagnosis not present

## 2020-11-10 DIAGNOSIS — M5136 Other intervertebral disc degeneration, lumbar region: Secondary | ICD-10-CM | POA: Diagnosis not present

## 2020-11-10 DIAGNOSIS — R262 Difficulty in walking, not elsewhere classified: Secondary | ICD-10-CM | POA: Diagnosis not present

## 2020-11-10 DIAGNOSIS — M4310 Spondylolisthesis, site unspecified: Secondary | ICD-10-CM | POA: Diagnosis not present

## 2020-11-12 DIAGNOSIS — M5136 Other intervertebral disc degeneration, lumbar region: Secondary | ICD-10-CM | POA: Diagnosis not present

## 2020-11-12 DIAGNOSIS — M4310 Spondylolisthesis, site unspecified: Secondary | ICD-10-CM | POA: Diagnosis not present

## 2020-11-12 DIAGNOSIS — M6281 Muscle weakness (generalized): Secondary | ICD-10-CM | POA: Diagnosis not present

## 2020-11-12 DIAGNOSIS — R262 Difficulty in walking, not elsewhere classified: Secondary | ICD-10-CM | POA: Diagnosis not present

## 2020-11-18 DIAGNOSIS — M6281 Muscle weakness (generalized): Secondary | ICD-10-CM | POA: Diagnosis not present

## 2020-11-18 DIAGNOSIS — R262 Difficulty in walking, not elsewhere classified: Secondary | ICD-10-CM | POA: Diagnosis not present

## 2020-11-18 DIAGNOSIS — M4310 Spondylolisthesis, site unspecified: Secondary | ICD-10-CM | POA: Diagnosis not present

## 2020-11-18 DIAGNOSIS — M5136 Other intervertebral disc degeneration, lumbar region: Secondary | ICD-10-CM | POA: Diagnosis not present

## 2020-11-19 DIAGNOSIS — M6281 Muscle weakness (generalized): Secondary | ICD-10-CM | POA: Diagnosis not present

## 2020-11-19 DIAGNOSIS — R262 Difficulty in walking, not elsewhere classified: Secondary | ICD-10-CM | POA: Diagnosis not present

## 2020-11-19 DIAGNOSIS — M5136 Other intervertebral disc degeneration, lumbar region: Secondary | ICD-10-CM | POA: Diagnosis not present

## 2020-11-19 DIAGNOSIS — M4310 Spondylolisthesis, site unspecified: Secondary | ICD-10-CM | POA: Diagnosis not present

## 2020-11-24 DIAGNOSIS — M6281 Muscle weakness (generalized): Secondary | ICD-10-CM | POA: Diagnosis not present

## 2020-11-24 DIAGNOSIS — R262 Difficulty in walking, not elsewhere classified: Secondary | ICD-10-CM | POA: Diagnosis not present

## 2020-11-24 DIAGNOSIS — M4310 Spondylolisthesis, site unspecified: Secondary | ICD-10-CM | POA: Diagnosis not present

## 2020-11-24 DIAGNOSIS — M5136 Other intervertebral disc degeneration, lumbar region: Secondary | ICD-10-CM | POA: Diagnosis not present

## 2020-11-27 DIAGNOSIS — M4310 Spondylolisthesis, site unspecified: Secondary | ICD-10-CM | POA: Diagnosis not present

## 2020-11-27 DIAGNOSIS — M6281 Muscle weakness (generalized): Secondary | ICD-10-CM | POA: Diagnosis not present

## 2020-11-27 DIAGNOSIS — M5136 Other intervertebral disc degeneration, lumbar region: Secondary | ICD-10-CM | POA: Diagnosis not present

## 2020-11-27 DIAGNOSIS — R262 Difficulty in walking, not elsewhere classified: Secondary | ICD-10-CM | POA: Diagnosis not present

## 2020-12-01 DIAGNOSIS — M5136 Other intervertebral disc degeneration, lumbar region: Secondary | ICD-10-CM | POA: Diagnosis not present

## 2020-12-01 DIAGNOSIS — M6281 Muscle weakness (generalized): Secondary | ICD-10-CM | POA: Diagnosis not present

## 2020-12-01 DIAGNOSIS — R262 Difficulty in walking, not elsewhere classified: Secondary | ICD-10-CM | POA: Diagnosis not present

## 2020-12-01 DIAGNOSIS — M4310 Spondylolisthesis, site unspecified: Secondary | ICD-10-CM | POA: Diagnosis not present

## 2020-12-04 DIAGNOSIS — M6281 Muscle weakness (generalized): Secondary | ICD-10-CM | POA: Diagnosis not present

## 2020-12-04 DIAGNOSIS — M5136 Other intervertebral disc degeneration, lumbar region: Secondary | ICD-10-CM | POA: Diagnosis not present

## 2020-12-04 DIAGNOSIS — M4310 Spondylolisthesis, site unspecified: Secondary | ICD-10-CM | POA: Diagnosis not present

## 2020-12-04 DIAGNOSIS — R262 Difficulty in walking, not elsewhere classified: Secondary | ICD-10-CM | POA: Diagnosis not present

## 2020-12-11 DIAGNOSIS — E782 Mixed hyperlipidemia: Secondary | ICD-10-CM | POA: Diagnosis not present

## 2020-12-11 DIAGNOSIS — M5136 Other intervertebral disc degeneration, lumbar region: Secondary | ICD-10-CM | POA: Diagnosis not present

## 2020-12-11 DIAGNOSIS — E039 Hypothyroidism, unspecified: Secondary | ICD-10-CM | POA: Diagnosis not present

## 2020-12-11 DIAGNOSIS — R4582 Worries: Secondary | ICD-10-CM | POA: Diagnosis not present

## 2020-12-11 DIAGNOSIS — I10 Essential (primary) hypertension: Secondary | ICD-10-CM | POA: Diagnosis not present

## 2020-12-11 DIAGNOSIS — D649 Anemia, unspecified: Secondary | ICD-10-CM | POA: Diagnosis not present

## 2020-12-11 DIAGNOSIS — M6281 Muscle weakness (generalized): Secondary | ICD-10-CM | POA: Diagnosis not present

## 2020-12-11 DIAGNOSIS — R262 Difficulty in walking, not elsewhere classified: Secondary | ICD-10-CM | POA: Diagnosis not present

## 2020-12-11 DIAGNOSIS — M4310 Spondylolisthesis, site unspecified: Secondary | ICD-10-CM | POA: Diagnosis not present

## 2020-12-11 DIAGNOSIS — J301 Allergic rhinitis due to pollen: Secondary | ICD-10-CM | POA: Diagnosis not present

## 2020-12-11 DIAGNOSIS — E7849 Other hyperlipidemia: Secondary | ICD-10-CM | POA: Diagnosis not present

## 2020-12-11 DIAGNOSIS — Z0001 Encounter for general adult medical examination with abnormal findings: Secondary | ICD-10-CM | POA: Diagnosis not present

## 2020-12-11 DIAGNOSIS — E1165 Type 2 diabetes mellitus with hyperglycemia: Secondary | ICD-10-CM | POA: Diagnosis not present

## 2020-12-11 DIAGNOSIS — E1142 Type 2 diabetes mellitus with diabetic polyneuropathy: Secondary | ICD-10-CM | POA: Diagnosis not present

## 2020-12-11 DIAGNOSIS — E1122 Type 2 diabetes mellitus with diabetic chronic kidney disease: Secondary | ICD-10-CM | POA: Diagnosis not present

## 2020-12-15 DIAGNOSIS — M6281 Muscle weakness (generalized): Secondary | ICD-10-CM | POA: Diagnosis not present

## 2020-12-15 DIAGNOSIS — M5136 Other intervertebral disc degeneration, lumbar region: Secondary | ICD-10-CM | POA: Diagnosis not present

## 2020-12-15 DIAGNOSIS — R262 Difficulty in walking, not elsewhere classified: Secondary | ICD-10-CM | POA: Diagnosis not present

## 2020-12-15 DIAGNOSIS — M4310 Spondylolisthesis, site unspecified: Secondary | ICD-10-CM | POA: Diagnosis not present

## 2020-12-18 DIAGNOSIS — M5136 Other intervertebral disc degeneration, lumbar region: Secondary | ICD-10-CM | POA: Diagnosis not present

## 2020-12-18 DIAGNOSIS — R262 Difficulty in walking, not elsewhere classified: Secondary | ICD-10-CM | POA: Diagnosis not present

## 2020-12-18 DIAGNOSIS — M4310 Spondylolisthesis, site unspecified: Secondary | ICD-10-CM | POA: Diagnosis not present

## 2020-12-18 DIAGNOSIS — M6281 Muscle weakness (generalized): Secondary | ICD-10-CM | POA: Diagnosis not present

## 2020-12-22 DIAGNOSIS — R262 Difficulty in walking, not elsewhere classified: Secondary | ICD-10-CM | POA: Diagnosis not present

## 2020-12-22 DIAGNOSIS — M6281 Muscle weakness (generalized): Secondary | ICD-10-CM | POA: Diagnosis not present

## 2020-12-22 DIAGNOSIS — M5136 Other intervertebral disc degeneration, lumbar region: Secondary | ICD-10-CM | POA: Diagnosis not present

## 2020-12-22 DIAGNOSIS — M4310 Spondylolisthesis, site unspecified: Secondary | ICD-10-CM | POA: Diagnosis not present

## 2020-12-29 DIAGNOSIS — M5136 Other intervertebral disc degeneration, lumbar region: Secondary | ICD-10-CM | POA: Diagnosis not present

## 2020-12-29 DIAGNOSIS — R262 Difficulty in walking, not elsewhere classified: Secondary | ICD-10-CM | POA: Diagnosis not present

## 2020-12-29 DIAGNOSIS — M4310 Spondylolisthesis, site unspecified: Secondary | ICD-10-CM | POA: Diagnosis not present

## 2020-12-29 DIAGNOSIS — M6281 Muscle weakness (generalized): Secondary | ICD-10-CM | POA: Diagnosis not present

## 2021-01-01 DIAGNOSIS — M4310 Spondylolisthesis, site unspecified: Secondary | ICD-10-CM | POA: Diagnosis not present

## 2021-01-01 DIAGNOSIS — R262 Difficulty in walking, not elsewhere classified: Secondary | ICD-10-CM | POA: Diagnosis not present

## 2021-01-01 DIAGNOSIS — M6281 Muscle weakness (generalized): Secondary | ICD-10-CM | POA: Diagnosis not present

## 2021-01-01 DIAGNOSIS — M5136 Other intervertebral disc degeneration, lumbar region: Secondary | ICD-10-CM | POA: Diagnosis not present

## 2021-01-07 DIAGNOSIS — M5136 Other intervertebral disc degeneration, lumbar region: Secondary | ICD-10-CM | POA: Diagnosis not present

## 2021-01-07 DIAGNOSIS — R262 Difficulty in walking, not elsewhere classified: Secondary | ICD-10-CM | POA: Diagnosis not present

## 2021-01-07 DIAGNOSIS — M6281 Muscle weakness (generalized): Secondary | ICD-10-CM | POA: Diagnosis not present

## 2021-01-07 DIAGNOSIS — M4310 Spondylolisthesis, site unspecified: Secondary | ICD-10-CM | POA: Diagnosis not present

## 2021-01-09 DIAGNOSIS — R262 Difficulty in walking, not elsewhere classified: Secondary | ICD-10-CM | POA: Diagnosis not present

## 2021-01-09 DIAGNOSIS — M5136 Other intervertebral disc degeneration, lumbar region: Secondary | ICD-10-CM | POA: Diagnosis not present

## 2021-01-09 DIAGNOSIS — M6281 Muscle weakness (generalized): Secondary | ICD-10-CM | POA: Diagnosis not present

## 2021-01-09 DIAGNOSIS — M4310 Spondylolisthesis, site unspecified: Secondary | ICD-10-CM | POA: Diagnosis not present

## 2021-01-14 DIAGNOSIS — M4310 Spondylolisthesis, site unspecified: Secondary | ICD-10-CM | POA: Diagnosis not present

## 2021-01-14 DIAGNOSIS — R262 Difficulty in walking, not elsewhere classified: Secondary | ICD-10-CM | POA: Diagnosis not present

## 2021-01-14 DIAGNOSIS — M6281 Muscle weakness (generalized): Secondary | ICD-10-CM | POA: Diagnosis not present

## 2021-01-14 DIAGNOSIS — M5136 Other intervertebral disc degeneration, lumbar region: Secondary | ICD-10-CM | POA: Diagnosis not present

## 2021-01-16 DIAGNOSIS — M5136 Other intervertebral disc degeneration, lumbar region: Secondary | ICD-10-CM | POA: Diagnosis not present

## 2021-01-16 DIAGNOSIS — M6281 Muscle weakness (generalized): Secondary | ICD-10-CM | POA: Diagnosis not present

## 2021-01-16 DIAGNOSIS — R262 Difficulty in walking, not elsewhere classified: Secondary | ICD-10-CM | POA: Diagnosis not present

## 2021-01-16 DIAGNOSIS — M4310 Spondylolisthesis, site unspecified: Secondary | ICD-10-CM | POA: Diagnosis not present

## 2021-01-21 DIAGNOSIS — M5136 Other intervertebral disc degeneration, lumbar region: Secondary | ICD-10-CM | POA: Diagnosis not present

## 2021-01-21 DIAGNOSIS — M4310 Spondylolisthesis, site unspecified: Secondary | ICD-10-CM | POA: Diagnosis not present

## 2021-01-21 DIAGNOSIS — M6281 Muscle weakness (generalized): Secondary | ICD-10-CM | POA: Diagnosis not present

## 2021-01-21 DIAGNOSIS — R262 Difficulty in walking, not elsewhere classified: Secondary | ICD-10-CM | POA: Diagnosis not present

## 2021-01-29 DIAGNOSIS — S39012A Strain of muscle, fascia and tendon of lower back, initial encounter: Secondary | ICD-10-CM | POA: Diagnosis not present

## 2021-01-29 DIAGNOSIS — M79671 Pain in right foot: Secondary | ICD-10-CM | POA: Diagnosis not present

## 2021-01-29 DIAGNOSIS — E1142 Type 2 diabetes mellitus with diabetic polyneuropathy: Secondary | ICD-10-CM | POA: Diagnosis not present

## 2021-01-29 DIAGNOSIS — I10 Essential (primary) hypertension: Secondary | ICD-10-CM | POA: Diagnosis not present

## 2021-01-29 DIAGNOSIS — E1122 Type 2 diabetes mellitus with diabetic chronic kidney disease: Secondary | ICD-10-CM | POA: Diagnosis not present

## 2021-01-29 DIAGNOSIS — E7849 Other hyperlipidemia: Secondary | ICD-10-CM | POA: Diagnosis not present

## 2021-01-29 DIAGNOSIS — E1165 Type 2 diabetes mellitus with hyperglycemia: Secondary | ICD-10-CM | POA: Diagnosis not present

## 2021-02-13 DIAGNOSIS — M5136 Other intervertebral disc degeneration, lumbar region: Secondary | ICD-10-CM | POA: Diagnosis not present

## 2021-02-13 DIAGNOSIS — M545 Low back pain, unspecified: Secondary | ICD-10-CM | POA: Diagnosis not present

## 2021-02-13 DIAGNOSIS — M5126 Other intervertebral disc displacement, lumbar region: Secondary | ICD-10-CM | POA: Diagnosis not present

## 2021-02-13 DIAGNOSIS — M48061 Spinal stenosis, lumbar region without neurogenic claudication: Secondary | ICD-10-CM | POA: Diagnosis not present

## 2021-04-03 DIAGNOSIS — M48062 Spinal stenosis, lumbar region with neurogenic claudication: Secondary | ICD-10-CM | POA: Diagnosis not present

## 2021-04-03 DIAGNOSIS — M4316 Spondylolisthesis, lumbar region: Secondary | ICD-10-CM | POA: Diagnosis not present

## 2021-04-07 ENCOUNTER — Other Ambulatory Visit: Payer: Self-pay | Admitting: Neurological Surgery

## 2021-04-07 DIAGNOSIS — E1142 Type 2 diabetes mellitus with diabetic polyneuropathy: Secondary | ICD-10-CM | POA: Diagnosis not present

## 2021-04-07 DIAGNOSIS — J301 Allergic rhinitis due to pollen: Secondary | ICD-10-CM | POA: Diagnosis not present

## 2021-04-07 DIAGNOSIS — E1122 Type 2 diabetes mellitus with diabetic chronic kidney disease: Secondary | ICD-10-CM | POA: Diagnosis not present

## 2021-04-07 DIAGNOSIS — E1165 Type 2 diabetes mellitus with hyperglycemia: Secondary | ICD-10-CM | POA: Diagnosis not present

## 2021-04-07 DIAGNOSIS — E039 Hypothyroidism, unspecified: Secondary | ICD-10-CM | POA: Diagnosis not present

## 2021-04-07 DIAGNOSIS — I10 Essential (primary) hypertension: Secondary | ICD-10-CM | POA: Diagnosis not present

## 2021-04-07 DIAGNOSIS — E782 Mixed hyperlipidemia: Secondary | ICD-10-CM | POA: Diagnosis not present

## 2021-04-07 DIAGNOSIS — Z7189 Other specified counseling: Secondary | ICD-10-CM | POA: Diagnosis not present

## 2021-04-07 DIAGNOSIS — R4582 Worries: Secondary | ICD-10-CM | POA: Diagnosis not present

## 2021-04-07 DIAGNOSIS — K2100 Gastro-esophageal reflux disease with esophagitis, without bleeding: Secondary | ICD-10-CM | POA: Diagnosis not present

## 2021-04-07 DIAGNOSIS — E7849 Other hyperlipidemia: Secondary | ICD-10-CM | POA: Diagnosis not present

## 2021-05-01 ENCOUNTER — Other Ambulatory Visit: Payer: Self-pay | Admitting: Family Medicine

## 2021-05-01 DIAGNOSIS — Z139 Encounter for screening, unspecified: Secondary | ICD-10-CM

## 2021-05-05 ENCOUNTER — Ambulatory Visit
Admission: RE | Admit: 2021-05-05 | Discharge: 2021-05-05 | Disposition: A | Discharge status: Home / Self Care | Payer: Medicare Other | Source: Ambulatory Visit | Attending: Family Medicine | Admitting: Family Medicine

## 2021-05-05 ENCOUNTER — Other Ambulatory Visit: Payer: Self-pay

## 2021-05-05 DIAGNOSIS — Z1231 Encounter for screening mammogram for malignant neoplasm of breast: Secondary | ICD-10-CM | POA: Diagnosis not present

## 2021-05-05 DIAGNOSIS — Z139 Encounter for screening, unspecified: Secondary | ICD-10-CM

## 2021-05-07 NOTE — Progress Notes (Signed)
Surgical Instructions    Your procedure is scheduled on 05/11/21.  Report to [facility] Main Entrance "A" at 08:15 A.M., then check in with the Admitting office.  Call this number if you have problems the morning of surgery:  [phone]   If you have any questions prior to your surgery date call [phone]: Open Monday-Friday 8am-4pm    Remember:  Do not eat or drink after midnight the night before your surgery      Take these medicines the morning of surgery with A SIP OF WATER  albuterol (VENTOLIN HFA) inhaler if needed (please bring inhalers with you the day of surgery) amLODipine (NORVASC)  atenolol (TENORMIN) busPIRone (BUSPAR) if needed for anxiety diphenhydrAMINE (BENADRYL) if needed DULoxetine (CYMBALTA) levothyroxine (SYNTHROID) pantoprazole (PROTONIX) as needed pravastatin (PRAVACHOL)   As of today, STOP taking any Aspirin (unless otherwise instructed by your surgeon) Aleve, Naproxen, Ibuprofen, Motrin, Advil, Goody's, BC's, all herbal medications, fish oil, and all vitamins. Please stop using your diclofenac Sodium (VOLTAREN) gel as well.   WHAT DO I DO ABOUT MY DIABETES MEDICATION?   Do not take oral diabetes medicines (pills) the morning of surgery.    THE MORNING OF SURGERY, do not take metFORMIN (GLUCOPHAGE).  The day of surgery, do not take other diabetes injectables, including Byetta (exenatide), Bydureon (exenatide ER), Victoza (liraglutide), or Trulicity (dulaglutide).  If your CBG is greater than 220 mg/dL, you may take  of your sliding scale (correction) dose of insulin.   HOW TO MANAGE YOUR DIABETES BEFORE AND AFTER SURGERY  Why is it important to control my blood sugar before and after surgery? Improving blood sugar levels before and after surgery helps healing and can limit problems. A way of improving blood sugar control is eating a healthy diet by:  Eating less sugar and carbohydrates  Increasing activity/exercise  Talking with your  doctor about reaching your blood sugar goals High blood sugars (greater than 180 mg/dL) can raise your risk of infections and slow your recovery, so you will need to focus on controlling your diabetes during the weeks before surgery. Make sure that the doctor who takes care of your diabetes knows about your planned surgery including the date and location.  How do I manage my blood sugar before surgery? Check your blood sugar at least 4 times a day, starting 2 days before surgery, to make sure that the level is not too high or low.  Check your blood sugar the morning of your surgery when you wake up and every 2 hours until you get to the Short Stay unit.  If your blood sugar is less than 70 mg/dL, you will need to treat for low blood sugar: Do not take insulin. Treat a low blood sugar (less than 70 mg/dL) with  cup of clear juice (cranberry or apple), 4 glucose tablets, OR glucose gel. Recheck blood sugar in 15 minutes after treatment (to make sure it is greater than 70 mg/dL). If your blood sugar is not greater than 70 mg/dL on recheck, call [phone] for further instructions. Report your blood sugar to the short stay nurse when you get to Short Stay.  If you are admitted to the hospital after surgery: Your blood sugar will be checked by the staff and you will probably be given insulin after surgery (instead of oral diabetes medicines) to make sure you have good blood sugar levels. The goal for blood sugar control after surgery is 80-180 mg/dL.  Do not wear jewelry or makeup Do not wear lotions, powders, perfumes/colognes, or deodorant. Do not shave 48 hours prior to surgery.  Men may shave face and neck. Do not bring valuables to the hospital. DO Not wear nail polish, gel polish, artificial nails, or any other type of covering on  natural nails including finger and toenails. If patients have artificial nails, gel coating, etc. that need to be removed by a nail salon please have  this removed prior to surgery or surgery may need to be canceled/delayed if the surgeon/ anesthesia feels like the patient is unable to be adequately monitored.             [facility] is not responsible for any belongings or valuables.  Do NOT Smoke (Tobacco/Vaping) or drink Alcohol 24 hours prior to your procedure If you use a CPAP at night, you may bring all equipment for your overnight stay.   Contacts, glasses, dentures or bridgework may not be worn into surgery, please bring cases for these belongings   For patients admitted to the hospital, discharge time will be determined by your treatment team.   Patients discharged the day of surgery will not be allowed to drive home, and someone needs to stay with them for 24 hours.  ONLY 1 SUPPORT PERSON MAY BE PRESENT WHILE YOU ARE IN SURGERY. IF YOU ARE TO BE ADMITTED ONCE YOU ARE IN YOUR ROOM YOU WILL BE ALLOWED TWO (2) VISITORS.  Minor children may have two parents present. Special consideration for safety and communication needs will be reviewed on a case by case basis.  Special instructions:    Oral Hygiene is also important to reduce your risk of infection.  Remember - BRUSH YOUR TEETH THE MORNING OF SURGERY WITH YOUR REGULAR TOOTHPASTE   [place]- Preparing For Surgery  Before surgery, you can play an important role. Because skin is not sterile, your skin needs to be as free of germs as possible. You can reduce the number of germs on your skin by washing with CHG (chlorahexidine gluconate) Soap before surgery.  CHG is an antiseptic cleaner which kills germs and bonds with the skin to continue killing germs even after washing.     Please do not use if you have an allergy to CHG or antibacterial soaps. If your skin becomes reddened/irritated stop using the CHG.  Do not shave (including legs and underarms) for at least 48 hours prior to first CHG shower. It is OK to shave your face.  Please follow these instructions carefully.      Shower the NIGHT BEFORE SURGERY and the MORNING OF SURGERY with CHG Soap.   If you chose to wash your hair, wash your hair first as usual with your normal shampoo. After you shampoo, rinse your hair and body thoroughly to remove the shampoo.  Then [employer] and genitals (private parts) with your normal soap and rinse thoroughly to remove soap.  After that Use CHG Soap as you would any other liquid soap. You can apply CHG directly to the skin and wash gently with a scrungie or a clean washcloth.   Apply the CHG Soap to your body ONLY FROM THE NECK DOWN.  Do not use on open wounds or open sores. Avoid contact with your eyes, ears, mouth and genitals (private parts). Wash Face and genitals (private parts)  with your normal soap.   Wash thoroughly, paying special attention to the area where your surgery will be performed.  Thoroughly rinse your  body with warm water from the neck down.  DO NOT shower/wash with your normal soap after using and rinsing off the CHG Soap.  Pat yourself dry with a CLEAN TOWEL.  Wear CLEAN PAJAMAS to bed the night before surgery  Place CLEAN SHEETS on your bed the night before your surgery  DO NOT SLEEP WITH PETS.   Day of Surgery: Take a shower with CHG soap. Wear Clean/Comfortable clothing the morning of surgery Do not apply any deodorants/lotions.   Remember to brush your teeth WITH YOUR REGULAR TOOTHPASTE.   Please read over the following fact sheets that you were given.

## 2021-05-08 ENCOUNTER — Other Ambulatory Visit: Payer: Self-pay

## 2021-05-08 ENCOUNTER — Other Ambulatory Visit: Payer: Self-pay | Admitting: Family Medicine

## 2021-05-08 ENCOUNTER — Encounter (HOSPITAL_COMMUNITY): Payer: Self-pay

## 2021-05-08 ENCOUNTER — Encounter (HOSPITAL_COMMUNITY)
Admission: RE | Admit: 2021-05-08 | Discharge: 2021-05-08 | Disposition: A | Discharge status: Home / Self Care | Payer: Medicare Other | Source: Ambulatory Visit | Attending: Neurological Surgery | Admitting: Neurological Surgery

## 2021-05-08 DIAGNOSIS — Z20822 Contact with and (suspected) exposure to covid-19: Secondary | ICD-10-CM | POA: Insufficient documentation

## 2021-05-08 DIAGNOSIS — R339 Retention of urine, unspecified: Secondary | ICD-10-CM | POA: Diagnosis not present

## 2021-05-08 DIAGNOSIS — E7800 Pure hypercholesterolemia, unspecified: Secondary | ICD-10-CM | POA: Diagnosis not present

## 2021-05-08 DIAGNOSIS — E119 Type 2 diabetes mellitus without complications: Secondary | ICD-10-CM | POA: Diagnosis not present

## 2021-05-08 DIAGNOSIS — M4316 Spondylolisthesis, lumbar region: Secondary | ICD-10-CM | POA: Diagnosis not present

## 2021-05-08 DIAGNOSIS — J45909 Unspecified asthma, uncomplicated: Secondary | ICD-10-CM | POA: Diagnosis not present

## 2021-05-08 DIAGNOSIS — M48062 Spinal stenosis, lumbar region with neurogenic claudication: Secondary | ICD-10-CM | POA: Diagnosis not present

## 2021-05-08 DIAGNOSIS — E039 Hypothyroidism, unspecified: Secondary | ICD-10-CM | POA: Diagnosis not present

## 2021-05-08 DIAGNOSIS — K08109 Complete loss of teeth, unspecified cause, unspecified class: Secondary | ICD-10-CM | POA: Diagnosis not present

## 2021-05-08 DIAGNOSIS — Z01812 Encounter for preprocedural laboratory examination: Secondary | ICD-10-CM | POA: Insufficient documentation

## 2021-05-08 DIAGNOSIS — R928 Other abnormal and inconclusive findings on diagnostic imaging of breast: Secondary | ICD-10-CM

## 2021-05-08 DIAGNOSIS — I10 Essential (primary) hypertension: Secondary | ICD-10-CM | POA: Diagnosis not present

## 2021-05-08 DIAGNOSIS — K219 Gastro-esophageal reflux disease without esophagitis: Secondary | ICD-10-CM | POA: Diagnosis not present

## 2021-05-08 DIAGNOSIS — M5416 Radiculopathy, lumbar region: Secondary | ICD-10-CM | POA: Diagnosis not present

## 2021-05-08 HISTORY — DX: Gastro-esophageal reflux disease without esophagitis: K21.9

## 2021-05-08 HISTORY — DX: Headache, unspecified: R51.9

## 2021-05-08 HISTORY — DX: Unspecified asthma, uncomplicated: J45.909

## 2021-05-08 HISTORY — DX: Pneumonia, unspecified organism: J18.9

## 2021-05-08 HISTORY — DX: Hypothyroidism, unspecified: E03.9

## 2021-05-08 HISTORY — DX: Dyspnea, unspecified: R06.00

## 2021-05-08 LAB — TYPE AND SCREEN
ABO/RH(D): AB POS
Antibody Screen: NEGATIVE

## 2021-05-08 LAB — BASIC METABOLIC PANEL
Anion gap: 10 (ref 5–15)
BUN: 19 mg/dL (ref 8–23)
CO2: 21 mmol/L — ABNORMAL LOW (ref 22–32)
Calcium: 9.6 mg/dL (ref 8.9–10.3)
Chloride: 107 mmol/L (ref 98–111)
Creatinine, Ser: 1.22 mg/dL — ABNORMAL HIGH (ref 0.44–1.00)
GFR, Estimated: 48 mL/min — ABNORMAL LOW (ref >60)
Glucose, Bld: 218 mg/dL — ABNORMAL HIGH (ref 70–99)
Potassium: 4.9 mmol/L (ref 3.5–5.1)
Sodium: 138 mmol/L (ref 135–145)

## 2021-05-08 LAB — CBC
HCT: 37.6 % (ref 36.0–46.0)
Hemoglobin: 12.3 g/dL (ref 12.0–15.0)
MCH: 28.1 pg (ref 26.0–34.0)
MCHC: 32.7 g/dL (ref 30.0–36.0)
MCV: 86 fL (ref 80.0–100.0)
Platelets: 307 10*3/uL (ref 150–400)
RBC: 4.37 MIL/uL (ref 3.87–5.11)
RDW: 14.6 % (ref 11.5–15.5)
WBC: 8.4 10*3/uL (ref 4.0–10.5)
nRBC: 0 % (ref 0.0–0.2)

## 2021-05-08 LAB — SARS CORONAVIRUS 2 (TAT 6-24 HRS): SARS Coronavirus 2: NEGATIVE

## 2021-05-08 LAB — SURGICAL PCR SCREEN
MRSA, PCR: NEGATIVE
Staphylococcus aureus: NEGATIVE

## 2021-05-08 LAB — GLUCOSE, CAPILLARY: Glucose-Capillary: 212 mg/dL — ABNORMAL HIGH (ref 70–99)

## 2021-05-08 NOTE — Progress Notes (Signed)
PCP - Gar Ponto Cardiologist - denies  PPM/ICD - denies   Chest x-ray - n/a EKG - 07/30/20 Stress Test - denies ECHO - denies Cardiac Cath - denies  Sleep Study - denies   Fasting Blood Sugar - 110-120 Checks Blood Sugar every day  As of today, STOP taking any Aspirin (unless otherwise instructed by your surgeon) Aleve, Naproxen, Ibuprofen, Motrin, Advil, Goody's, BC's, all herbal medications, fish oil, and all vitamins. Please stop using your diclofenac Sodium (VOLTAREN) gel as well.   ERAS Protcol -no   COVID TEST- 05/08/21 in PAT   Anesthesia review: no  Patient denies shortness of breath, fever, cough and chest pain at PAT appointment   All instructions explained to the patient, with a verbal understanding of the material. Patient agrees to go over the instructions while at home for a better understanding. Patient also instructed to self quarantine after being tested for COVID-19. The opportunity to ask questions was provided.

## 2021-05-09 LAB — HEMOGLOBIN A1C
Hgb A1c MFr Bld: 8.1 % — ABNORMAL HIGH (ref 4.8–5.6)
Mean Plasma Glucose: 186 mg/dL

## 2021-05-11 ENCOUNTER — Encounter (HOSPITAL_COMMUNITY): Payer: Self-pay | Admitting: Neurological Surgery

## 2021-05-11 ENCOUNTER — Inpatient Hospital Stay (HOSPITAL_COMMUNITY): Payer: Medicare Other | Admitting: Anesthesiology

## 2021-05-11 ENCOUNTER — Encounter (HOSPITAL_COMMUNITY)
Admission: RE | Disposition: A | Discharge status: Home / Self Care | Payer: Self-pay | Source: Home / Self Care | Attending: Neurological Surgery

## 2021-05-11 ENCOUNTER — Inpatient Hospital Stay (HOSPITAL_COMMUNITY): Payer: Medicare Other

## 2021-05-11 ENCOUNTER — Other Ambulatory Visit: Payer: Self-pay

## 2021-05-11 ENCOUNTER — Inpatient Hospital Stay (HOSPITAL_COMMUNITY)
Admission: RE | Admit: 2021-05-11 | Discharge: 2021-05-13 | DRG: 455 | Disposition: A | Discharge status: Home Health | Payer: Medicare Other | Attending: Neurological Surgery | Admitting: Neurological Surgery

## 2021-05-11 DIAGNOSIS — E7800 Pure hypercholesterolemia, unspecified: Secondary | ICD-10-CM | POA: Diagnosis present

## 2021-05-11 DIAGNOSIS — Z20822 Contact with and (suspected) exposure to covid-19: Secondary | ICD-10-CM | POA: Diagnosis present

## 2021-05-11 DIAGNOSIS — M4316 Spondylolisthesis, lumbar region: Secondary | ICD-10-CM | POA: Diagnosis not present

## 2021-05-11 DIAGNOSIS — K08109 Complete loss of teeth, unspecified cause, unspecified class: Secondary | ICD-10-CM | POA: Diagnosis present

## 2021-05-11 DIAGNOSIS — I10 Essential (primary) hypertension: Secondary | ICD-10-CM | POA: Diagnosis not present

## 2021-05-11 DIAGNOSIS — M5416 Radiculopathy, lumbar region: Secondary | ICD-10-CM | POA: Diagnosis present

## 2021-05-11 DIAGNOSIS — J45909 Unspecified asthma, uncomplicated: Secondary | ICD-10-CM | POA: Diagnosis not present

## 2021-05-11 DIAGNOSIS — Z8701 Personal history of pneumonia (recurrent): Secondary | ICD-10-CM | POA: Diagnosis not present

## 2021-05-11 DIAGNOSIS — M48062 Spinal stenosis, lumbar region with neurogenic claudication: Secondary | ICD-10-CM | POA: Diagnosis present

## 2021-05-11 DIAGNOSIS — E039 Hypothyroidism, unspecified: Secondary | ICD-10-CM | POA: Diagnosis not present

## 2021-05-11 DIAGNOSIS — F419 Anxiety disorder, unspecified: Secondary | ICD-10-CM | POA: Diagnosis present

## 2021-05-11 DIAGNOSIS — R339 Retention of urine, unspecified: Secondary | ICD-10-CM | POA: Diagnosis not present

## 2021-05-11 DIAGNOSIS — K219 Gastro-esophageal reflux disease without esophagitis: Secondary | ICD-10-CM | POA: Diagnosis not present

## 2021-05-11 DIAGNOSIS — E119 Type 2 diabetes mellitus without complications: Secondary | ICD-10-CM | POA: Diagnosis not present

## 2021-05-11 DIAGNOSIS — Z419 Encounter for procedure for purposes other than remedying health state, unspecified: Secondary | ICD-10-CM

## 2021-05-11 DIAGNOSIS — D509 Iron deficiency anemia, unspecified: Secondary | ICD-10-CM | POA: Diagnosis not present

## 2021-05-11 DIAGNOSIS — Z981 Arthrodesis status: Secondary | ICD-10-CM | POA: Diagnosis not present

## 2021-05-11 HISTORY — PX: TRANSFORAMINAL LUMBAR INTERBODY FUSION (TLIF) WITH PEDICLE SCREW FIXATION 2 LEVEL: SHX6142

## 2021-05-11 LAB — GLUCOSE, CAPILLARY
Glucose-Capillary: 239 mg/dL — ABNORMAL HIGH (ref 70–99)
Glucose-Capillary: 205 mg/dL — ABNORMAL HIGH (ref 70–99)
Glucose-Capillary: 211 mg/dL — ABNORMAL HIGH (ref 70–99)
Glucose-Capillary: 179 mg/dL — ABNORMAL HIGH (ref 70–99)
Glucose-Capillary: 186 mg/dL — ABNORMAL HIGH (ref 70–99)
Glucose-Capillary: 202 mg/dL — ABNORMAL HIGH (ref 70–99)
Glucose-Capillary: 220 mg/dL — ABNORMAL HIGH (ref 70–99)
Glucose-Capillary: 193 mg/dL — ABNORMAL HIGH (ref 70–99)
Glucose-Capillary: 216 mg/dL — ABNORMAL HIGH (ref 70–99)

## 2021-05-11 SURGERY — TRANSFORAMINAL LUMBAR INTERBODY FUSION (TLIF) WITH PEDICLE SCREW FIXATION 2 LEVEL
Anesthesia: General | Site: Spine Lumbar

## 2021-05-11 MED ORDER — ACETAMINOPHEN 650 MG RE SUPP: 650.0000 mg | RECTAL | Status: DC | PRN

## 2021-05-11 MED ORDER — INSULIN ASPART 100 UNIT/ML IJ SOLN
INTRAMUSCULAR | Status: AC
  Filled 2021-05-11: qty 1

## 2021-05-11 MED ORDER — ACETAMINOPHEN 325 MG PO TABS
650.0000 mg | ORAL_TABLET | ORAL | Status: DC | PRN
  Administered 2021-05-11 – 2021-05-13 (×5): 650 mg via ORAL
  Filled 2021-05-11 (×5): qty 2

## 2021-05-11 MED ORDER — CEFAZOLIN SODIUM-DEXTROSE 2-4 GM/100ML-% IV SOLN
2.0000 g | INTRAVENOUS | Status: AC
  Administered 2021-05-11: 2 g via INTRAVENOUS

## 2021-05-11 MED ORDER — OXYCODONE HCL 5 MG PO TABS
ORAL_TABLET | ORAL | Status: AC
  Filled 2021-05-11: qty 1

## 2021-05-11 MED ORDER — OXYCODONE HCL 5 MG/5ML PO SOLN: 5.0000 mg | Freq: Once | ORAL | Status: DC | PRN

## 2021-05-11 MED ORDER — TRAZODONE HCL 100 MG PO TABS
100.0000 mg | ORAL_TABLET | Freq: Every evening | ORAL | Status: DC | PRN
  Filled 2021-05-11: qty 1

## 2021-05-11 MED ORDER — INSULIN ASPART 100 UNIT/ML IJ SOLN
INTRAMUSCULAR | Status: DC | PRN
  Administered 2021-05-11: 6 [IU] via SUBCUTANEOUS

## 2021-05-11 MED ORDER — LIDOCAINE-EPINEPHRINE 1 %-1:100000 IJ SOLN
INTRAMUSCULAR | Status: DC | PRN
  Administered 2021-05-11: 5 mL

## 2021-05-11 MED ORDER — FENTANYL CITRATE (PF) 100 MCG/2ML IJ SOLN
25.0000 ug | INTRAMUSCULAR | Status: DC | PRN
  Administered 2021-05-11 (×2): 50 ug via INTRAVENOUS

## 2021-05-11 MED ORDER — LACTATED RINGERS IV SOLN: INTRAVENOUS | Status: DC

## 2021-05-11 MED ORDER — FENTANYL CITRATE (PF) 250 MCG/5ML IJ SOLN
INTRAMUSCULAR | Status: AC
  Filled 2021-05-11: qty 5

## 2021-05-11 MED ORDER — CYCLOBENZAPRINE HCL 10 MG PO TABS
10.0000 mg | ORAL_TABLET | Freq: Three times a day (TID) | ORAL | Status: DC | PRN
  Administered 2021-05-11 – 2021-05-12 (×3): 10 mg via ORAL
  Filled 2021-05-11 (×3): qty 1

## 2021-05-11 MED ORDER — ORAL CARE MOUTH RINSE: 15.0000 mL | Freq: Once | OROMUCOSAL | Status: AC

## 2021-05-11 MED ORDER — CHLORHEXIDINE GLUCONATE 0.12 % MT SOLN
15.0000 mL | Freq: Once | OROMUCOSAL | Status: AC
  Administered 2021-05-11: 15 mL via OROMUCOSAL

## 2021-05-11 MED ORDER — ALBUTEROL SULFATE HFA 108 (90 BASE) MCG/ACT IN AERS: 2.0000 | INHALATION_SPRAY | RESPIRATORY_TRACT | Status: DC | PRN

## 2021-05-11 MED ORDER — HYDROMORPHONE HCL 1 MG/ML IJ SOLN
1.0000 mg | INTRAMUSCULAR | Status: DC | PRN
  Administered 2021-05-11: 1 mg via INTRAVENOUS
  Filled 2021-05-11: qty 1

## 2021-05-11 MED ORDER — SODIUM CHLORIDE 0.9% FLUSH: 3.0000 mL | INTRAVENOUS | Status: DC | PRN

## 2021-05-11 MED ORDER — ONDANSETRON HCL 4 MG/2ML IJ SOLN: 4.0000 mg | Freq: Once | INTRAMUSCULAR | Status: DC | PRN

## 2021-05-11 MED ORDER — DULOXETINE HCL 30 MG PO CPEP
30.0000 mg | ORAL_CAPSULE | Freq: Every morning | ORAL | Status: DC
  Administered 2021-05-12 – 2021-05-13 (×2): 30 mg via ORAL
  Filled 2021-05-11 (×2): qty 1

## 2021-05-11 MED ORDER — OXYCODONE HCL 5 MG PO TABS
5.0000 mg | ORAL_TABLET | Freq: Once | ORAL | Status: AC | PRN
  Administered 2021-05-11: 5 mg via ORAL

## 2021-05-11 MED ORDER — FENTANYL CITRATE (PF) 100 MCG/2ML IJ SOLN: 25.0000 ug | INTRAMUSCULAR | Status: DC | PRN

## 2021-05-11 MED ORDER — PHENOL 1.4 % MT LIQD: 1.0000 | OROMUCOSAL | Status: DC | PRN

## 2021-05-11 MED ORDER — DICLOFENAC SODIUM 1 % EX GEL
2.0000 g | Freq: Four times a day (QID) | CUTANEOUS | Status: DC | PRN
  Filled 2021-05-11: qty 100

## 2021-05-11 MED ORDER — OXYCODONE HCL 5 MG PO TABS: 5.0000 mg | ORAL_TABLET | ORAL | Status: DC | PRN

## 2021-05-11 MED ORDER — CHLORHEXIDINE GLUCONATE CLOTH 2 % EX PADS: 6.0000 | MEDICATED_PAD | Freq: Once | CUTANEOUS | Status: DC

## 2021-05-11 MED ORDER — THROMBIN 5000 UNITS EX SOLR: OROMUCOSAL | Status: DC | PRN

## 2021-05-11 MED ORDER — LIDOCAINE 2% (20 MG/ML) 5 ML SYRINGE
INTRAMUSCULAR | Status: DC | PRN
  Administered 2021-05-11: 60 mg via INTRAVENOUS

## 2021-05-11 MED ORDER — LIDOCAINE-EPINEPHRINE 1 %-1:100000 IJ SOLN
INTRAMUSCULAR | Status: AC
  Filled 2021-05-11: qty 1

## 2021-05-11 MED ORDER — ALBUTEROL SULFATE (2.5 MG/3ML) 0.083% IN NEBU: 2.5000 mg | INHALATION_SOLUTION | RESPIRATORY_TRACT | Status: DC | PRN

## 2021-05-11 MED ORDER — BUSPIRONE HCL 15 MG PO TABS
15.0000 mg | ORAL_TABLET | Freq: Three times a day (TID) | ORAL | Status: DC | PRN
  Filled 2021-05-11: qty 1

## 2021-05-11 MED ORDER — ONDANSETRON HCL 4 MG/2ML IJ SOLN
INTRAMUSCULAR | Status: DC | PRN
  Administered 2021-05-11: 4 mg via INTRAVENOUS

## 2021-05-11 MED ORDER — SUGAMMADEX SODIUM 200 MG/2ML IV SOLN
INTRAVENOUS | Status: DC | PRN
  Administered 2021-05-11: 200 mg via INTRAVENOUS

## 2021-05-11 MED ORDER — OXYCODONE HCL 5 MG PO TABS
10.0000 mg | ORAL_TABLET | ORAL | Status: DC | PRN
  Administered 2021-05-11 – 2021-05-12 (×2): 10 mg via ORAL
  Filled 2021-05-11 (×2): qty 2

## 2021-05-11 MED ORDER — INSULIN ASPART 100 UNIT/ML IJ SOLN
4.0000 [IU] | Freq: Once | INTRAMUSCULAR | Status: AC
  Administered 2021-05-11: 4 [IU] via SUBCUTANEOUS
  Filled 2021-05-11: qty 0.04

## 2021-05-11 MED ORDER — CYCLOBENZAPRINE HCL 10 MG PO TABS
ORAL_TABLET | ORAL | Status: AC
  Filled 2021-05-11: qty 1

## 2021-05-11 MED ORDER — ONDANSETRON HCL 4 MG/2ML IJ SOLN: 4.0000 mg | Freq: Four times a day (QID) | INTRAMUSCULAR | Status: DC | PRN

## 2021-05-11 MED ORDER — AMLODIPINE BESYLATE 5 MG PO TABS
5.0000 mg | ORAL_TABLET | Freq: Every day | ORAL | Status: DC
  Administered 2021-05-13: 5 mg via ORAL
  Filled 2021-05-11: qty 1

## 2021-05-11 MED ORDER — DOCUSATE SODIUM 100 MG PO CAPS
100.0000 mg | ORAL_CAPSULE | Freq: Two times a day (BID) | ORAL | Status: DC
  Administered 2021-05-11 – 2021-05-13 (×4): 100 mg via ORAL
  Filled 2021-05-11 (×4): qty 1

## 2021-05-11 MED ORDER — CEFAZOLIN SODIUM-DEXTROSE 2-4 GM/100ML-% IV SOLN
2.0000 g | Freq: Three times a day (TID) | INTRAVENOUS | Status: AC
  Administered 2021-05-11 – 2021-05-12 (×2): 2 g via INTRAVENOUS
  Filled 2021-05-11 (×2): qty 100

## 2021-05-11 MED ORDER — OXYCODONE HCL 5 MG/5ML PO SOLN: 5.0000 mg | Freq: Once | ORAL | Status: AC | PRN

## 2021-05-11 MED ORDER — EPHEDRINE SULFATE-NACL 50-0.9 MG/10ML-% IV SOSY
PREFILLED_SYRINGE | INTRAVENOUS | Status: DC | PRN
  Administered 2021-05-11 (×2): 5 mg via INTRAVENOUS
  Administered 2021-05-11: 10 mg via INTRAVENOUS

## 2021-05-11 MED ORDER — BUPIVACAINE HCL (PF) 0.5 % IJ SOLN
INTRAMUSCULAR | Status: AC
  Filled 2021-05-11: qty 30

## 2021-05-11 MED ORDER — CEFAZOLIN SODIUM-DEXTROSE 2-4 GM/100ML-% IV SOLN
INTRAVENOUS | Status: AC
  Filled 2021-05-11: qty 100

## 2021-05-11 MED ORDER — IRBESARTAN 150 MG PO TABS
150.0000 mg | ORAL_TABLET | Freq: Every day | ORAL | Status: DC
  Administered 2021-05-11: 150 mg via ORAL
  Filled 2021-05-11: qty 1

## 2021-05-11 MED ORDER — PANTOPRAZOLE SODIUM 40 MG PO TBEC
40.0000 mg | DELAYED_RELEASE_TABLET | Freq: Every day | ORAL | Status: DC
  Administered 2021-05-11 – 2021-05-13 (×3): 40 mg via ORAL
  Filled 2021-05-11 (×3): qty 1

## 2021-05-11 MED ORDER — OXYCODONE HCL 5 MG PO TABS: 5.0000 mg | ORAL_TABLET | Freq: Once | ORAL | Status: DC | PRN

## 2021-05-11 MED ORDER — FENTANYL CITRATE (PF) 250 MCG/5ML IJ SOLN
INTRAMUSCULAR | Status: DC | PRN
  Administered 2021-05-11: 100 ug via INTRAVENOUS
  Administered 2021-05-11 (×2): 25 ug via INTRAVENOUS
  Administered 2021-05-11: 50 ug via INTRAVENOUS

## 2021-05-11 MED ORDER — BUSPIRONE HCL 15 MG PO TABS
15.0000 mg | ORAL_TABLET | Freq: Three times a day (TID) | ORAL | Status: DC
  Filled 2021-05-11 (×2): qty 1

## 2021-05-11 MED ORDER — AMLODIPINE BESYLATE 5 MG PO TABS: 5.0000 mg | ORAL_TABLET | Freq: Every day | ORAL | Status: DC

## 2021-05-11 MED ORDER — MENTHOL 3 MG MT LOZG: 1.0000 | LOZENGE | OROMUCOSAL | Status: DC | PRN

## 2021-05-11 MED ORDER — PROPOFOL 10 MG/ML IV BOLUS
INTRAVENOUS | Status: AC
  Filled 2021-05-11: qty 20

## 2021-05-11 MED ORDER — POLYETHYLENE GLYCOL 3350 17 G PO PACK: 17.0000 g | PACK | Freq: Every day | ORAL | Status: DC | PRN

## 2021-05-11 MED ORDER — FENTANYL CITRATE (PF) 100 MCG/2ML IJ SOLN
INTRAMUSCULAR | Status: AC
  Filled 2021-05-11: qty 2

## 2021-05-11 MED ORDER — PHENYLEPHRINE HCL-NACL 10-0.9 MG/250ML-% IV SOLN
INTRAVENOUS | Status: DC | PRN
  Administered 2021-05-11: 20 ug/min via INTRAVENOUS

## 2021-05-11 MED ORDER — EPHEDRINE 5 MG/ML INJ
INTRAVENOUS | Status: AC
  Filled 2021-05-11: qty 10

## 2021-05-11 MED ORDER — LIDOCAINE HCL (PF) 2 % IJ SOLN
INTRAMUSCULAR | Status: AC
  Filled 2021-05-11: qty 5

## 2021-05-11 MED ORDER — 0.9 % SODIUM CHLORIDE (POUR BTL) OPTIME
TOPICAL | Status: DC | PRN
  Administered 2021-05-11 (×2): 1000 mL

## 2021-05-11 MED ORDER — LEVOTHYROXINE SODIUM 50 MCG PO TABS
50.0000 ug | ORAL_TABLET | Freq: Every day | ORAL | Status: DC
  Administered 2021-05-12 – 2021-05-13 (×2): 50 ug via ORAL
  Filled 2021-05-11: qty 2
  Filled 2021-05-11 (×2): qty 1
  Filled 2021-05-11: qty 2

## 2021-05-11 MED ORDER — ONDANSETRON HCL 4 MG/2ML IJ SOLN
INTRAMUSCULAR | Status: AC
  Filled 2021-05-11: qty 4

## 2021-05-11 MED ORDER — ROCURONIUM BROMIDE 10 MG/ML (PF) SYRINGE
PREFILLED_SYRINGE | INTRAVENOUS | Status: AC
  Filled 2021-05-11: qty 20

## 2021-05-11 MED ORDER — PROPOFOL 10 MG/ML IV BOLUS
INTRAVENOUS | Status: DC | PRN
  Administered 2021-05-11: 160 mg via INTRAVENOUS

## 2021-05-11 MED ORDER — ROCURONIUM BROMIDE 10 MG/ML (PF) SYRINGE
PREFILLED_SYRINGE | INTRAVENOUS | Status: DC | PRN
  Administered 2021-05-11: 50 mg via INTRAVENOUS
  Administered 2021-05-11: 20 mg via INTRAVENOUS
  Administered 2021-05-11: 10 mg via INTRAVENOUS
  Administered 2021-05-11 (×2): 20 mg via INTRAVENOUS

## 2021-05-11 MED ORDER — DEXAMETHASONE SODIUM PHOSPHATE 10 MG/ML IJ SOLN
INTRAMUSCULAR | Status: AC
  Filled 2021-05-11: qty 1

## 2021-05-11 MED ORDER — ATENOLOL 25 MG PO TABS
25.0000 mg | ORAL_TABLET | Freq: Every day | ORAL | Status: DC
  Administered 2021-05-13: 25 mg via ORAL
  Filled 2021-05-11: qty 1

## 2021-05-11 MED ORDER — THROMBIN 5000 UNITS EX SOLR
CUTANEOUS | Status: AC
  Filled 2021-05-11: qty 5000

## 2021-05-11 MED ORDER — PRAVASTATIN SODIUM 10 MG PO TABS
20.0000 mg | ORAL_TABLET | Freq: Every day | ORAL | Status: DC
  Administered 2021-05-11 – 2021-05-13 (×3): 20 mg via ORAL
  Filled 2021-05-11 (×3): qty 2

## 2021-05-11 MED ORDER — METFORMIN HCL 500 MG PO TABS
1000.0000 mg | ORAL_TABLET | Freq: Two times a day (BID) | ORAL | Status: DC
  Administered 2021-05-11 – 2021-05-13 (×4): 1000 mg via ORAL
  Filled 2021-05-11 (×4): qty 2

## 2021-05-11 MED ORDER — CHLORHEXIDINE GLUCONATE 0.12 % MT SOLN
OROMUCOSAL | Status: AC
  Filled 2021-05-11: qty 15

## 2021-05-11 MED ORDER — SODIUM CHLORIDE 0.9% FLUSH
3.0000 mL | Freq: Two times a day (BID) | INTRAVENOUS | Status: DC
  Administered 2021-05-11 – 2021-05-12 (×3): 3 mL via INTRAVENOUS

## 2021-05-11 MED ORDER — BUPIVACAINE HCL (PF) 0.5 % IJ SOLN
INTRAMUSCULAR | Status: DC | PRN
  Administered 2021-05-11: 5 mL

## 2021-05-11 MED ORDER — SODIUM CHLORIDE 0.9 % IV SOLN
250.0000 mL | INTRAVENOUS | Status: DC
  Administered 2021-05-11: 250 mL via INTRAVENOUS

## 2021-05-11 MED ORDER — ONDANSETRON HCL 4 MG PO TABS: 4.0000 mg | ORAL_TABLET | Freq: Four times a day (QID) | ORAL | Status: DC | PRN

## 2021-05-11 SURGICAL SUPPLY — 51 items
ADH SKN CLS APL DERMABOND .7 (GAUZE/BANDAGES/DRESSINGS) ×1
BASKET BONE COLLECTION (BASKET) ×3 IMPLANT
BLADE SURG 11 STRL SS (BLADE) ×3 IMPLANT
BUR MATCHSTICK NEURO 3.0 LAGG (BURR) ×3 IMPLANT
BUR PRECISION FLUTE 5.0 (BURR) ×3 IMPLANT
CANISTER SUCT 3000ML PPV (MISCELLANEOUS) ×3 IMPLANT
CNTNR URN SCR LID CUP LEK RST (MISCELLANEOUS) ×1 IMPLANT
CONT SPEC 4OZ STRL OR WHT (MISCELLANEOUS) ×3
COVER BACK TABLE 60X90IN (DRAPES) ×3 IMPLANT
DERMABOND ADVANCED (GAUZE/BANDAGES/DRESSINGS) ×2
DERMABOND ADVANCED .7 DNX12 (GAUZE/BANDAGES/DRESSINGS) ×1 IMPLANT
DEVICE INTERBODY ELEVATE 9X23 (Cage) ×6 IMPLANT
DRAPE C-ARM 42X72 X-RAY (DRAPES) ×3 IMPLANT
DRAPE C-ARMOR (DRAPES) ×3 IMPLANT
DRAPE LAPAROTOMY 100X72X124 (DRAPES) ×3 IMPLANT
DRAPE SURG 17X23 STRL (DRAPES) ×3 IMPLANT
DURAPREP 26ML APPLICATOR (WOUND CARE) ×3 IMPLANT
ELECT REM PT RETURN 9FT ADLT (ELECTROSURGICAL) ×3
ELECTRODE REM PT RTRN 9FT ADLT (ELECTROSURGICAL) ×1 IMPLANT
GAUZE 4X4 16PLY RFD (DISPOSABLE) IMPLANT
GLOVE BIOGEL PI IND STRL 7.5 (GLOVE) ×2 IMPLANT
GLOVE BIOGEL PI INDICATOR 7.5 (GLOVE) ×4
GLOVE ECLIPSE 7.5 STRL STRAW (GLOVE) ×6 IMPLANT
GLOVE SURG UNDER POLY LF SZ7.5 (GLOVE) ×6 IMPLANT
GOWN STRL REUS W/ TWL LRG LVL3 (GOWN DISPOSABLE) ×3 IMPLANT
GOWN STRL REUS W/ TWL XL LVL3 (GOWN DISPOSABLE) ×2 IMPLANT
GOWN STRL REUS W/TWL 2XL LVL3 (GOWN DISPOSABLE) ×3 IMPLANT
GOWN STRL REUS W/TWL LRG LVL3 (GOWN DISPOSABLE) ×9
GOWN STRL REUS W/TWL XL LVL3 (GOWN DISPOSABLE) ×6
HEMOSTAT POWDER KIT SURGIFOAM (HEMOSTASIS) ×3 IMPLANT
KIT BASIN OR (CUSTOM PROCEDURE TRAY) ×3 IMPLANT
KIT POSITION SURG JACKSON T1 (MISCELLANEOUS) ×3 IMPLANT
KIT TURNOVER KIT B (KITS) ×3 IMPLANT
MILL MEDIUM DISP (BLADE) ×3 IMPLANT
NEEDLE HYPO 22GX1.5 SAFETY (NEEDLE) ×3 IMPLANT
NEEDLE SPNL 18GX3.5 QUINCKE PK (NEEDLE) ×3 IMPLANT
NS IRRIG 1000ML POUR BTL (IV SOLUTION) ×6 IMPLANT
PACK LAMINECTOMY NEURO (CUSTOM PROCEDURE TRAY) ×3 IMPLANT
ROD PED SOLERA 5.5X60 (Rod) ×6 IMPLANT
SCREW 6.5X40 (Screw) ×18 IMPLANT
SCREW SET SOLERA (Screw) ×18 IMPLANT
SCREW SET SOLERA TI5.5 (Screw) ×6 IMPLANT
SPONGE LAP 4X18 RFD (DISPOSABLE) IMPLANT
SUT MNCRL AB 3-0 PS2 18 (SUTURE) ×3 IMPLANT
SUT VIC AB 0 CT1 18XCR BRD8 (SUTURE) ×1 IMPLANT
SUT VIC AB 0 CT1 8-18 (SUTURE) ×3
SUT VIC AB 2-0 CP2 18 (SUTURE) ×3 IMPLANT
TOWEL GREEN STERILE (TOWEL DISPOSABLE) ×3 IMPLANT
TOWEL GREEN STERILE FF (TOWEL DISPOSABLE) ×3 IMPLANT
TRAY FOLEY MTR SLVR 16FR STAT (SET/KITS/TRAYS/PACK) ×3 IMPLANT
WATER STERILE IRR 1000ML POUR (IV SOLUTION) ×3 IMPLANT

## 2021-05-11 NOTE — Anesthesia Preprocedure Evaluation (Signed)
Anesthesia Evaluation  Patient identified by MRN, date of birth, ID band Patient awake    Reviewed: Allergy & Precautions, NPO status , Patient's Chart, lab work & pertinent test results  Airway Mallampati: II   Neck ROM: Full    Dental  (+) Edentulous Upper, Edentulous Lower   Pulmonary    breath sounds clear to auscultation       Cardiovascular hypertension,  Rhythm:Regular Rate:Normal     Neuro/Psych    GI/Hepatic   Endo/Other  diabetes  Renal/GU      Musculoskeletal   Abdominal (+) + obese,   Peds  Hematology   Anesthesia Other Findings   Reproductive/Obstetrics                             Anesthesia Physical Anesthesia Plan  ASA: 3  Anesthesia Plan: General   Post-op Pain Management:    Induction: Intravenous  PONV Risk Score and Plan: Ondansetron and Dexamethasone  Airway Management Planned: Oral ETT  Additional Equipment:   Intra-op Plan:   Post-operative Plan: Extubation in OR  Informed Consent: I have reviewed the patients History and Physical, chart, labs and discussed the procedure including the risks, benefits and alternatives for the proposed anesthesia with the patient or authorized representative who has indicated his/her understanding and acceptance.       Plan Discussed with: CRNA and Anesthesiologist  Anesthesia Plan Comments: (Type 2 DM Glucose 239 will give 5 u novolog Subq Hypertension Anxiety GERD  Plan GA with oral ETT )        Anesthesia Quick Evaluation

## 2021-05-11 NOTE — Progress Notes (Signed)
Dr. Linna Caprice aware of BG 239.  He stated he will look at her chart and come to bedside.

## 2021-05-11 NOTE — Op Note (Signed)
PATIENT: [name]  DAY OF SURGERY: 05/11/21   PRE-OPERATIVE DIAGNOSIS:  Lumbar stenosis with neurogenic claudication, lumbar radiculopathy, lumbar mobile spondylolisthesis, lumbar radiculopathy   POST-OPERATIVE DIAGNOSIS:  Same   PROCEDURE:  L3-4, L4-5 laminectomies, foraminotomies, right L3-4, L4-5 TLIF with bilateral L3-L5 pedicle screw placement and posterolateral fusion   SURGEON:  Surgeon(s) and Role:    Makaya Part, MD - Primary   ANESTHESIA: ETGA   BRIEF HISTORY: This is a 70 year old woman who presented with bilateral lower extremity and low back pain. The patient was found to have spondylolisthesis, mobile at L4-5, with severe canal stenosis at L3-4 and L4-5. I therefore recommended decompression and instrumented fusion. This was discussed with the patient as well as risks, benefits, and alternatives and wished to proceed with surgery.   OPERATIVE DETAIL: The patient was taken to the operating room and anesthesia was induced by the anesthesia team. They were placed on the OR table in the prone position with padding of all pressure points. A formal time out was performed with two patient identifiers and confirmed the operative site. The operative site was marked, hair was clipped with surgical clippers, the area was then prepped and draped in a sterile fashion. Fluoro was used to localize the operative level and a midline incision was placed to expose from L3 to L5. Subperiosteal dissection was performed bilaterally and fluoroscopy was again used to confirm the surgical level.   Decompression was performed, which consisted of laminectomies at L3 and L4 with removal of ligamentum flavum from L3 down to the superior aspect of the lamina of L5. Obviously this was more than would be needed for the TLIF alone. A complete facetectomy was then performed on the left at L3-4 and on the right at L4-5 to prepare for the TLIF at each level.   Instrumentation was then performed at  L3-4 then L4-5. On the left at L3-4 and then right on L4-5. At each level, this was performed by identifying and and protecting the traversing root, exiting root, and thecal sac while performing all disc work. An annulotomy was created and a discectomy was performed with a combination of curettes and rongeurs, using fluoroscopy as needed. The endplates were prepped with a curette, the previously harvested lamina were morselized and placed in the disc space as autograft, and an expandable interbody cage ([employer]tronic) was placed and expanded.   Fluoroscopy was used to guide placement of bilateral pedicle screws ([employer]tronic) at L3, L4, and L5 bilaterally. These were placed by localizing the pedicle with standard landmarks, drilling a pilot hole, cannulating the pedicle with an awl, palpating for pedicle wall breaches, tapping, palpating, and then placing the screw. These were connected with rods bilaterally and final tightened according to manufacturer torque specifications. The bone was thoroughly decorticated over the fusion surface and the previously resected bone fragments were morselized and used as autograft for the posterolateral fusion.  All instrument and sponge counts were correct, hemostasis was obtained and confirmed, the wound was copiously irrigated, then closed in layers. Final fluoroscopic images showed hardware in good position. The patient was then returned to anesthesia for emergence. No apparent complications at the completion of the procedure.   EBL:  371mL   DRAINS: none   SPECIMENS: none   Rashae Part, MD 05/11/21 10:24 AM

## 2021-05-11 NOTE — Anesthesia Procedure Notes (Signed)
Procedure Name: Intubation Date/Time: 05/11/2021 11:31 AM Performed by: Janene Harvey, CRNA Pre-anesthesia Checklist: Patient identified, Emergency Drugs available, Suction available and Patient being monitored Patient Re-evaluated:Patient Re-evaluated prior to induction Oxygen Delivery Method: Circle system utilized Preoxygenation: Pre-oxygenation with 100% oxygen Induction Type: IV induction Ventilation: Mask ventilation without difficulty and Oral airway inserted - appropriate to patient size Laryngoscope Size: Mac and 3 Grade View: Grade I Tube type: Oral Tube size: 7.0 mm Number of attempts: 1 Airway Equipment and Method: Stylet and Oral airway Placement Confirmation: ETT inserted through vocal cords under direct vision, positive ETCO2 and breath sounds checked- equal and bilateral Secured at: 21 cm Tube secured with: Tape Dental Injury: Teeth and Oropharynx as per pre-operative assessment  Comments: Intubation by srna

## 2021-05-11 NOTE — H&P (Signed)
Surgical H&P Update  HPI: 70 y.o. woman with a history of low back pain, left greater than right lower extremity radiculopathy, workup revealed spondylolistheses at L3-4 and L4-5 with severe canal and foraminal stenosis. Her symptoms were refractory to non-surgical management, I therefore recommended surgery. No changes in health since she was last seen. Still having symptoms and wishes to proceed with surgery.  PMHx:  Past Medical History:  Diagnosis Date   Anxiety    Asthma    Diabetes mellitus    type 2   Dyspnea    GERD (gastroesophageal reflux disease)    Headache    Hypercholesteremia    Hypertension    Hypothyroidism    Noncompliance with medication regimen    Pneumonia    Thyroid disease    FamHx:  Family History  Problem Relation Age of Onset   Breast cancer Neg Hx    SocHx:  reports that she has never smoked. She has never used smokeless tobacco. She reports that she does not drink alcohol and does not use drugs.  Physical Exam: AOx3, PERRL, FS, TM  Strength 5/5 x4, SILTx4  Assesment/Plan: 70 y.o. woman with L3-4/L4-5 spondylolisthesis and severe stenosis, here for L3-4/L4-5 open decompression and TLIF. Risks, benefits, and alternatives discussed and the patient would like to continue with surgery.  -OR today -3C post-op  Abbegail Part, MD 05/11/21 9:54 AM

## 2021-05-11 NOTE — Transfer of Care (Signed)
Immediate Anesthesia Transfer of Care Note  Patient: [name]  Procedure(s) Performed: Lumbar Three-Four, Lumbar Four-Five Open Laminectomy with Transforaminal Lumbar Interbody Fusion (Spine Lumbar)  Patient Location: PACU  Anesthesia Type:General  Level of Consciousness: awake, alert  and patient cooperative  Airway & Oxygen Therapy: Patient Spontanous Breathing and Patient connected to face mask oxygen  Post-op Assessment: Report given to RN and Post -op Vital signs reviewed and stable  Post vital signs: Reviewed and stable  Last Vitals:  Vitals Value Taken Time  BP 159/65 05/11/21 1604  Temp    Pulse 72 05/11/21 1604  Resp 13 05/11/21 1604  SpO2 92 % 05/11/21 1604  Vitals shown include unvalidated device data.  Last Pain:  Vitals:   05/11/21 0839  TempSrc:   PainSc: 7          Complications: No notable events documented.

## 2021-05-11 NOTE — Anesthesia Postprocedure Evaluation (Signed)
Anesthesia Post Note  Patient: [name]  Procedure(s) Performed: Lumbar Three-Four, Lumbar Four-Five Open Laminectomy with Transforaminal Lumbar Interbody Fusion (Spine Lumbar)     Patient location during evaluation: PACU Anesthesia Type: General Level of consciousness: awake and alert Pain management: pain level controlled Vital Signs Assessment: post-procedure vital signs reviewed and stable Respiratory status: spontaneous breathing, nonlabored ventilation, respiratory function stable and patient connected to nasal cannula oxygen Cardiovascular status: blood pressure returned to baseline and stable Postop Assessment: no apparent nausea or vomiting Anesthetic complications: no   No notable events documented.  Last Vitals:  Vitals:   05/11/21 1732 05/11/21 1749  BP:  (!) 162/70  Pulse:  70  Resp:  18  Temp: (!) 36.4 C   SpO2:  94%    Last Pain:  Vitals:   05/11/21 1846  TempSrc:   PainSc: 5                  Kiran Lapine COKER

## 2021-05-11 NOTE — Brief Op Note (Signed)
05/11/2021  3:54 PM  PATIENT:  [name]  70 y.o. female  PRE-OPERATIVE DIAGNOSIS:  Spondylolisthesis, lumbar region  POST-OPERATIVE DIAGNOSIS:  Spondylolisthesis, lumbar region  PROCEDURE:  Procedure(s): Lumbar Three-Four, Lumbar Four-Five Open Laminectomy with Transforaminal Lumbar Interbody Fusion (N/A)  SURGEON:  Surgeon(s) and Role:    * Deadra Part, MD - Primary  PHYSICIAN ASSISTANT:   ASSISTANTS: none   ANESTHESIA:   general  EBL:  300 mL   BLOOD ADMINISTERED:none  DRAINS: none   LOCAL MEDICATIONS USED:  LIDOCAINE   SPECIMEN:  No Specimen  DISPOSITION OF SPECIMEN:  N/A  COUNTS:  YES  TOURNIQUET:  * No tourniquets in log *  DICTATION: .Note written in EPIC  PLAN OF CARE: Admit to inpatient   PATIENT DISPOSITION:  PACU - hemodynamically stable.   Delay start of Pharmacological VTE agent (>24hrs) due to surgical blood loss or risk of bleeding: yes

## 2021-05-12 ENCOUNTER — Encounter (HOSPITAL_COMMUNITY): Payer: Self-pay | Admitting: Neurological Surgery

## 2021-05-12 LAB — GLUCOSE, CAPILLARY
Glucose-Capillary: 173 mg/dL — ABNORMAL HIGH (ref 70–99)
Glucose-Capillary: 232 mg/dL — ABNORMAL HIGH (ref 70–99)
Glucose-Capillary: 284 mg/dL — ABNORMAL HIGH (ref 70–99)
Glucose-Capillary: 178 mg/dL — ABNORMAL HIGH (ref 70–99)

## 2021-05-12 MED ORDER — INSULIN ASPART 100 UNIT/ML IJ SOLN
0.0000 [IU] | Freq: Three times a day (TID) | INTRAMUSCULAR | Status: DC
  Administered 2021-05-12: 8 [IU] via SUBCUTANEOUS
  Administered 2021-05-13: 5 [IU] via SUBCUTANEOUS

## 2021-05-12 MED ORDER — TAMSULOSIN HCL 0.4 MG PO CAPS
0.4000 mg | ORAL_CAPSULE | Freq: Every day | ORAL | Status: DC
  Administered 2021-05-12: 0.4 mg via ORAL
  Filled 2021-05-12: qty 1

## 2021-05-12 MED ORDER — OXYCODONE HCL 5 MG PO TABS
10.0000 mg | ORAL_TABLET | ORAL | Status: DC | PRN
  Administered 2021-05-12 – 2021-05-13 (×6): 10 mg via ORAL
  Filled 2021-05-12 (×6): qty 2

## 2021-05-12 MED ORDER — INSULIN ASPART 100 UNIT/ML IJ SOLN: 0.0000 [IU] | Freq: Every day | INTRAMUSCULAR | Status: DC

## 2021-05-12 MED ORDER — CYCLOBENZAPRINE HCL 10 MG PO TABS: 10.0000 mg | ORAL_TABLET | Freq: Three times a day (TID) | ORAL | 0 refills | Status: AC | PRN

## 2021-05-12 MED ORDER — INSULIN ASPART 100 UNIT/ML IJ SOLN
0.0000 [IU] | Freq: Three times a day (TID) | INTRAMUSCULAR | Status: DC
Start: 2021-05-13 — End: 2021-05-12

## 2021-05-12 MED ORDER — DOCUSATE SODIUM 100 MG PO CAPS: 100.0000 mg | ORAL_CAPSULE | Freq: Two times a day (BID) | ORAL | 0 refills | Status: AC

## 2021-05-12 MED ORDER — OXYCODONE HCL 10 MG PO TABS: 10.0000 mg | ORAL_TABLET | ORAL | 0 refills | Status: AC | PRN

## 2021-05-12 MED ORDER — OXYCODONE HCL 5 MG PO TABS
5.0000 mg | ORAL_TABLET | ORAL | Status: DC | PRN
  Administered 2021-05-12 – 2021-05-13 (×2): 5 mg via ORAL
  Filled 2021-05-12 (×2): qty 1

## 2021-05-12 NOTE — TOC Initial Note (Addendum)
Transition of Care [facility]) - Initial/Assessment Note    Patient Details  Name: [name] MRN: [id number] Date of Birth: 10/16/1951  [facility]) CM/SW Contact:    Joanne Chars, LCSW Phone Number: 05/12/2021, 9:56 AM  Clinical Narrative:   CSW met with pt regarding recommendation for [facility].  Pt agreeable to this, choice document given, no agency preference indicated.  Permission given to speak with daughter Philis Nettle.  PCP in place.  Pt has been vaccinated for covid and boosted.    [facility] agreed to accept this referral.                Expected Discharge Plan: [place] Services Barriers to Discharge: Other (must enter comment) ([place] recommendation with [place])   Patient Goals and CMS Choice Patient states their goals for this hospitalization and ongoing recovery are:: "get better" CMS Medicare.gov Compare Post Acute Care list provided to:: Patient Choice offered to / list presented to : Patient  Expected Discharge Plan and Services Expected Discharge Plan: [place] In-house Referral: Clinical Social Work   Post Acute Care Choice: [place] arrangements for the past 2 months: Mobile Home Expected Discharge Date: 05/12/21               DME Arranged: N/A (DME through [facility] staff)                    Prior Living Arrangements/Services Living arrangements for the past 2 months: Mobile Home Lives with:: Adult Children Patient language and need for interpreter reviewed:: Yes Do you feel safe going back to the place where you live?: Yes      Need for Family Participation in Patient Care: Yes (Comment) Care giver support system in place?: Yes (comment) Current home services: Other (comment) (none) Criminal Activity/Legal Involvement Pertinent to Current Situation/Hospitalization: No - Comment as needed  Activities of Daily Living      Permission Sought/Granted Permission sought to share information with :  Family Supports Permission granted to share information with : Yes, Verbal Permission Granted  Share Information with NAME: daughter Philis Nettle           Emotional Assessment Appearance:: Appears stated age Attitude/Demeanor/Rapport: Engaged Affect (typically observed): Appropriate, Pleasant Orientation: : Oriented to Self, Oriented to Place, Oriented to  Time, Oriented to Situation Alcohol / Substance Use: Not Applicable Psych Involvement: No (comment)  Admission diagnosis:  Lumbar stenosis with neurogenic claudication [M48.062] Patient Active Problem List   Diagnosis Date Noted   Lumbar stenosis with neurogenic claudication 05/11/2021   DM 01/21/2010   HYPERCHOLESTEROLEMIA 01/21/2010   ANEMIA, IRON DEFICIENCY 01/21/2010   HEMOCCULT POSITIVE STOOL 01/21/2010   ANXIETY NEUROSIS 01/20/2010   HYPERTENSION 01/20/2010   GERD 01/20/2010   DYSPHAGIA UNSPECIFIED 01/20/2010   PCP:  Caryl Bis, MD Pharmacy:   [facility] [address], [place] - [id number] [place] [address] Phone: [phone] Fax: [phone]     Social Determinants of Health (SDOH) Interventions    Readmission Risk Interventions No flowsheet data found.

## 2021-05-12 NOTE — Discharge Summary (Signed)
Discharge Summary  Date of Admission: 05/11/2021  Date of Discharge: 05/12/21  Attending Physician: Emelda Brothers, MD  Hospital Course: Patient was admitted following an uncomplicated open U8-8/B1-6 TLIF. She was recovered in PACU and transferred to [facility]. Her hospital course was uncomplicated and the patient was discharged home on 05/12/21. She will follow up in clinic with me in 2 weeks.  Neurologic exam at discharge:  Strength 5/5 x4, SILTx4 Incision c/d/i  Discharge diagnosis: Lumbar stenosis, lumbar radiculopathy, lumbar spondylolisthesis  Jeffery Part, MD 05/12/21 7:11 AM

## 2021-05-12 NOTE — Evaluation (Signed)
Physical Therapy Evaluation Patient Details Name: [name] MRN: [id number] DOB: March 18, 1951 Today's Date: 05/12/2021   History of Present Illness  Pt is a 70 yr old female who s/p  L3-4/L4-5 TLIF. PMH: DM2, anxiety, asthma, dyspena  Clinical Impression  Patient is s/p above surgery resulting in the deficits listed below (see PT Problem List). On eval, pt required min assist bed mobility, min guard assist sit to stand, min guard assist ambulation 100' x 2 with RW, and min assist ascend/descend 2 steps with R rail. Patient will benefit from skilled PT to increase their independence and safety with mobility (while adhering to their precautions) to allow discharge to the venue listed below.     Follow Up Recommendations Home health PT;Supervision/Assistance - 24 hour    Equipment Recommendations  Rolling walker with 5" wheels (pediatric, pt is 4'9")    Recommendations for Other Services       Precautions / Restrictions Precautions Precautions: Back Precaution Booklet Issued: Yes (comment) Precaution Comments: Pt reviewed and also nurse made aware due to decrease awareness of precautions to show daughter as well Restrictions Weight Bearing Restrictions: No Other Position/Activity Restrictions: no brace      Mobility  Bed Mobility Overal bed mobility: Needs Assistance Bed Mobility: Rolling;Supine to Sit Rolling: Min assist   Supine to sit: Min assist   Sit to sidelying: Min assist General bed mobility comments: Pt needed cues on how to complete lof roll    Transfers Overall transfer level: Needs assistance Equipment used: Standard walker Transfers: Sit to/from Stand Sit to Stand: Min guard         General transfer comment: from elevated surface  Ambulation/Gait Ambulation/Gait assistance: Min guard Gait Distance (Feet): 100 Feet (x 2) Assistive device: Rolling walker (2 wheeled) Gait Pattern/deviations: Step-through pattern;Decreased stride length Gait  velocity: decreased Gait velocity interpretation: <1.31 ft/sec, indicative of household ambulator General Gait Details: multiple standing rest breaks due to pain and fatigue. Seated rest break after 100'.  Stairs Stairs: Yes Stairs assistance: Min assist Stair Management: One rail Right;Sideways;Step to pattern Number of Stairs: 2 General stair comments: cues for sequencing. Pt declining more than 2 steps due to pain.  Wheelchair Mobility    Modified Rankin (Stroke Patients Only)       Balance Overall balance assessment: Mild deficits observed, not formally tested                                           Pertinent Vitals/Pain Pain Assessment: 0-10 Pain Score: 8  Pain Location: back Pain Descriptors / Indicators: Aching;Shooting Pain Intervention(s): Monitored during session;Repositioned;Premedicated before session;Ice applied    Home Living Family/patient expects to be discharged to:: Private residence Living Arrangements: Alone Available Help at Discharge: Family Type of Home: House Home Access: Stairs to enter Entrance Stairs-Rails: Can reach both (at daughters home) Technical brewer of Steps: 10 at daughters home, 4 at her home Home Layout: One level Home Equipment: Environmental consultant - 2 wheels (but they reported now lost in the hospital) Additional Comments: Pt reports they live with family member but they are currently in the hospital as well so going to daughter's home until they can transition to their home    Prior Function Level of Independence: Needs assistance (due to pain PLOF)               Hand Dominance  Extremity/Trunk Assessment   Upper Extremity Assessment Upper Extremity Assessment: Generalized weakness    Lower Extremity Assessment Lower Extremity Assessment: Generalized weakness    Cervical / Trunk Assessment Cervical / Trunk Assessment: Other exceptions (s/p sx) Cervical / Trunk Exceptions: s/p TLIF   Communication   Communication: No difficulties  Cognition Arousal/Alertness: Awake/alert Behavior During Therapy: WFL for tasks assessed/performed Overall Cognitive Status: Within Functional Limits for tasks assessed                                        General Comments      Exercises     Assessment/Plan    PT Assessment Patient needs continued PT services  PT Problem List Decreased strength;Decreased mobility;Decreased knowledge of precautions;Decreased activity tolerance;Pain;Decreased knowledge of use of DME;Decreased balance       PT Treatment Interventions DME instruction;Therapeutic activities;Gait training;Therapeutic exercise;Patient/family education;Balance training;Stair training;Functional mobility training    PT Goals (Current goals can be found in the Care Plan section)  Acute Rehab PT Goals Patient Stated Goal: to decrease pain PT Goal Formulation: With patient Time For Goal Achievement: 05/19/21 Potential to Achieve Goals: Good    Frequency Min 5X/week   Barriers to discharge        Co-evaluation               AM-PAC PT "6 Clicks" Mobility  Outcome Measure Help needed turning from your back to your side while in a flat bed without using bedrails?: A Little Help needed moving from lying on your back to sitting on the side of a flat bed without using bedrails?: A Little Help needed moving to and from a bed to a chair (including a wheelchair)?: A Little Help needed standing up from a chair using your arms (e.g., wheelchair or bedside chair)?: A Little Help needed to walk in hospital room?: A Little Help needed climbing 3-5 steps with a railing? : A Little 6 Click Score: 18    End of Session Equipment Utilized During Treatment: Gait belt Activity Tolerance: Patient limited by pain Patient left: in bed;with call bell/phone within reach Nurse Communication: Mobility status;Patient requests pain meds PT Visit Diagnosis: Muscle  weakness (generalized) (M62.81);Difficulty in walking, not elsewhere classified (R26.2);Pain    Time: 4174-0814 PT Time Calculation (min) (ACUTE ONLY): 24 min   Charges:   PT Evaluation $PT Eval Moderate Complexity: 1 Mod PT Treatments $Gait Training: 8-22 mins        Lorrin Goodell, PT  Office # [phone] Pager [phone]   Lorriane Shire 05/12/2021, 9:05 AM

## 2021-05-12 NOTE — Discharge Instructions (Signed)
Discharge Instructions ° °No restriction in activities, slowly increase your activity back to normal.  ° °Your incision is closed with dermabond (purple glue). This will naturally fall off over the next 1-2 weeks.  ° °Okay to shower on the day of discharge. Use regular soap and water and try to be gentle when cleaning your incision.  ° °Follow up with Dr. Demarco Bacci in 2 weeks after discharge. If you do not already have a discharge appointment, please call his office at [phone] to schedule a follow up appointment. If you have any concerns or questions, please call the office and let us know. °

## 2021-05-12 NOTE — Evaluation (Signed)
Occupational Therapy Evaluation Patient Details Name: [name] MRN: [id number] DOB: 1951/07/04 Today's Date: 05/12/2021    History of Present Illness Pt is a 70 yr old female who s/p  L3-4/L4-5 TLIF. PMH: DM2, anxiety, asthma, dyspena   Clinical Impression   Pt plans to return to her daughter's home once dc. Pt required min assist for bed mobility, min guard from elevated surfaces for sit to stand, min to moderate assist for LE dressing due to pain and following precautions. Pt currently with functional limitations due to the deficits listed below (see OT Problem List).  Pt will benefit from skilled OT to increase their safety and independence with ADL and functional mobility for ADL to facilitate discharge to venue listed below.      Follow Up Recommendations  Home health OT;Supervision - Intermittent    Equipment Recommendations  3 in 1 bedside commode;Other (comment) (Pt reported they lost walker and now needs youth walker)    Recommendations for Other Services       Precautions / Restrictions Precautions Precautions: Back Precaution Booklet Issued: Yes (comment) Precaution Comments: Pt reviewed and also nurse made aware due to decrease awareness of precautions to show daughter as well Restrictions Weight Bearing Restrictions: No Other Position/Activity Restrictions: no brace      Mobility Bed Mobility Overal bed mobility: Needs Assistance Bed Mobility: Rolling;Supine to Sit Rolling: Min assist   Supine to sit: Min assist   Sit to sidelying: Min assist General bed mobility comments: Pt needed cues on how to complete lof roll    Transfers Overall transfer level: Needs assistance Equipment used: Standard walker Transfers: Sit to/from Stand Sit to Stand: Min guard         General transfer comment: from elevated surface    Balance Overall balance assessment: Mild deficits observed, not formally tested                                          ADL either performed or assessed with clinical judgement   ADL Overall ADL's : Needs assistance/impaired Eating/Feeding: Independent;Sitting   Grooming: Wash/dry hands;Wash/dry face;Min guard;Cueing for safety;Cueing for sequencing;Standing   Upper Body Bathing: Set up;Cueing for safety;Cueing for sequencing;Standing   Lower Body Bathing: Minimal assistance;Cueing for safety;Cueing for sequencing;Sit to/from stand   Upper Body Dressing : Set up;Sitting   Lower Body Dressing: Minimal assistance;Sit to/from stand   Toilet Transfer: Min guard;Grab bars;Cueing for safety;Cueing for sequencing   Toileting- Water quality scientist and Hygiene: Min guard;Cueing for safety;Cueing for sequencing;Sit to/from stand   Tub/ Shower Transfer: Minimal assistance;Cueing for safety;Cueing for sequencing   Functional mobility during ADLs: Min guard;Rolling walker       Vision Baseline Vision/History: Wears glasses Wears Glasses: At all times Vision Assessment?: No apparent visual deficits     Perception Perception Perception Tested?: No   Praxis      Pertinent Vitals/Pain Pain Assessment: 0-10 Pain Score: 8  Pain Location: back Pain Descriptors / Indicators: Aching;Shooting Pain Intervention(s): Monitored during session;Repositioned;Premedicated before session;Ice applied     Hand Dominance     Extremity/Trunk Assessment Upper Extremity Assessment Upper Extremity Assessment: Generalized weakness   Lower Extremity Assessment Lower Extremity Assessment: Generalized weakness   Cervical / Trunk Assessment Cervical / Trunk Assessment: Other exceptions (s/p sx) Cervical / Trunk Exceptions: s/p TLIF   Communication Communication Communication: No difficulties   Cognition Arousal/Alertness: Awake/alert Behavior During Therapy:  WFL for tasks assessed/performed Overall Cognitive Status: Within Functional Limits for tasks assessed                                      General Comments       Exercises     Shoulder Instructions      Home Living Family/patient expects to be discharged to:: Private residence Living Arrangements: Alone Available Help at Discharge: Family Type of Home: House Home Access: Stairs to enter [employer] of Steps: 10 at daughters home, 4 at her home Entrance Stairs-Rails: Can reach both (at daughters home) Home Layout: One level     Bathroom Shower/Tub: Gaffer (at daughters home)     Bathroom Accessibility: Yes How Accessible: Accessible via Swift: Environmental consultant - 2 wheels (but they reported now lost in the hospital)   Additional Comments: Pt reports they live with family member but they are currently in the hospital as well so going to daughter's home until they can transition to their home      Prior Functioning/Environment Level of Independence: Needs assistance (due to pain PLOF)                 OT Problem List: Decreased strength;Decreased range of motion;Decreased activity tolerance;Impaired balance (sitting and/or standing);Decreased safety awareness;Pain      OT Treatment/Interventions: Self-care/ADL training;Therapeutic exercise;Therapeutic activities;Patient/family education;Balance training    OT Goals(Current goals can be found in the care plan section) Acute Rehab OT Goals Patient Stated Goal: to decrease pain OT Goal Formulation: With patient Time For Goal Achievement: 05/16/21 Potential to Achieve Goals: Good ADL Goals Pt Will Perform Upper Body Bathing: with modified independence;sitting Pt Will Perform Lower Body Bathing: with modified independence;sit to/from stand Pt Will Perform Tub/Shower Transfer: with modified independence;shower seat;tub bench  OT Frequency: Min 2X/week   Barriers to D/C:            Co-evaluation              AM-PAC OT "6 Clicks" Daily Activity     Outcome Measure Help from another person eating meals?: None Help from  another person taking care of personal grooming?: A Little Help from another person toileting, which includes using toliet, bedpan, or urinal?: A Little Help from another person bathing (including washing, rinsing, drying)?: A Little Help from another person to put on and taking off regular upper body clothing?: None Help from another person to put on and taking off regular lower body clothing?: A Little 6 Click Score: 20   End of Session Equipment Utilized During Treatment: Gait belt;Rolling walker Nurse Communication: Other (comment) (following precuations)  Activity Tolerance: Patient limited by pain Patient left: in bed;with call bell/phone within reach;Other (comment) (Physical Therapy present)  OT Visit Diagnosis: Unsteadiness on feet (R26.81);Other abnormalities of gait and mobility (R26.89);Muscle weakness (generalized) (M62.81);Pain Pain - part of body:  (back)                Time: 0720-0752 OT Time Calculation (min): 32 min Charges:  OT General Charges $OT Visit: 1 Visit OT Evaluation $OT Eval Low Complexity: 1 Low OT Treatments $Self Care/Home Management : 8-22 mins  Joeseph Amor OTR/L  Acute Rehab Services  [phone] office number [phone] pager number   Joeseph Amor 05/12/2021, 9:11 AM

## 2021-05-12 NOTE — Progress Notes (Signed)
Neurosurgery Service Progress Note  Subjective: No acute events overnight, preop pain resolved, just incisional pain today   Objective: Vitals:   05/11/21 1749 05/11/21 1959 05/12/21 0007 05/12/21 0359  BP: (!) 162/70 (!) 157/61 (!) 142/63 (!) 142/60  Pulse: 70 72 76 78  Resp: 18 18 18 16   Temp:  98.4 F (36.9 C) 99.8 F (37.7 C) 98.9 F (37.2 C)  TempSrc:  Oral Oral Oral  SpO2: 94% 97% 96% 95%  Weight:      Height:        Physical Exam: Strength 5/5 x4, SILTx4 Incision c/d/i  Assessment & Plan: 70 y.o. woman s/p 2 level open TLIF, recovering well.  -discharge home today  Embrie Part  05/12/21 7:06 AM

## 2021-05-12 NOTE — Progress Notes (Signed)
Inpatient Diabetes Program Recommendations  AACE/ADA: New Consensus Statement on Inpatient Glycemic Control   Target Ranges:  Prepandial:   less than 140 mg/dL      Peak postprandial:   less than 180 mg/dL (1-2 hours)      Critically ill patients:  140 - 180 mg/dL  Results for [name], [name] (MRN [id number]) as of 05/12/2021 06:51  Ref. Range 05/11/2021 08:24 05/11/2021 09:55 05/11/2021 10:26 05/11/2021 12:13 05/11/2021 13:30 05/11/2021 14:54 05/11/2021 16:00 05/11/2021 16:55 05/11/2021 21:13  Glucose-Capillary Latest Ref Range: 70 - 99 mg/dL 239 (H) 211 (H) 205 (H) 179 (H) 186 (H) 202 (H) 220 (H) 193 (H) 216 (H)   Review of Glycemic Control  Diabetes history: DM2 Outpatient Diabetes medications: Metformin 1000 mg BID Current orders for Inpatient glycemic control: Metformin 1000 mg BID  Inpatient Diabetes Program Recommendations:    Insulin: While inpatient, please consider ordering CBGs AC&HS with Novolog 0-15 units TID with meals and Novolog 0-5 units QHS.  Thanks, Barnie Alderman, RN, MSN, CDE Diabetes Coordinator Inpatient Diabetes Program [phone] (Team Pager from 8am to 5pm)

## 2021-05-13 LAB — GLUCOSE, CAPILLARY: Glucose-Capillary: 200 mg/dL — ABNORMAL HIGH (ref 70–99)

## 2021-05-13 MED ORDER — MAGNESIUM CITRATE PO SOLN
1.0000 | Freq: Once | ORAL | Status: AC
  Administered 2021-05-13: 1 via ORAL
  Filled 2021-05-13: qty 296

## 2021-05-13 MED ORDER — TAMSULOSIN HCL 0.4 MG PO CAPS: 0.4000 mg | ORAL_CAPSULE | Freq: Every day | ORAL | 0 refills | Status: AC
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
## 2021-05-13 NOTE — Progress Notes (Signed)
Neurosurgery Service Progress Note  Subjective: No acute events overnight, stayed yesterday 2/2 some urinary retention but resolved w/ flomax  Objective: Vitals:   05/12/21 2006 05/12/21 2350 05/13/21 0340 05/13/21 0833  BP: (!) 110/42 (!) 126/49 (!) 140/54 (!) 119/54  Pulse: 91 93 96 (!) 103  Resp: 20 20 20 16   Temp: 99.7 F (37.6 C) 99 F (37.2 C) 99.1 F (37.3 C) 97.9 F (36.6 C)  TempSrc: Oral Oral Oral Oral  SpO2: 94% 95% 97% 97%  Weight:      Height:        Physical Exam: Strength 5/5 x4, SILTx4 Incision c/d/i  Assessment & Plan: 70 y.o. woman s/p 2 level open TLIF, recovering well.  -discharge home today  Elliana Part  05/13/21 8:39 AM

## 2021-05-13 NOTE — Progress Notes (Signed)
Patient alert and oriented, voiding adequately, skin clean, dry and intact without evidence of skin break down, or symptoms of complications - no redness or edema noted, only slight tenderness at site.  Patient states pain is manageable at time of discharge. All supplies- (DME) given to aptient at discharge. Patient has an appointment with MD in 2 weeks

## 2021-05-13 NOTE — Progress Notes (Signed)
Occupational Therapy Treatment Patient Details Name: [name] MRN: [id number] DOB: 01/13/1951 Today's Date: 05/13/2021    History of present illness Pt is a 70 yr old female who s/p  L3-4/L4-5 TLIF. PMH: DM2, anxiety, asthma, dyspena   OT comments  Pt presented sitting EOB and 7/10 pain. Pt at this time unable to report precautions at this time and was educated and reviewed handout with the pt. Pt at this time required supervision with sit to stand transfers, RW and supervision with ambulation and min assist with LE dressing.     Follow Up Recommendations  Home health OT;Supervision - Intermittent    Equipment Recommendations  3 in 1 bedside commode (youth walker)    Recommendations for Other Services      Precautions / Restrictions Precautions Precautions: Back Precaution Comments: Reviewed with the pt and was able to report 0/3 precautions Restrictions Weight Bearing Restrictions: No Other Position/Activity Restrictions: no brace       Mobility Bed Mobility Overal bed mobility:  (Pt present sitting at EOB)                  Transfers Overall transfer level: Needs assistance Equipment used: Standard walker Transfers: Sit to/from Stand Sit to Stand: Supervision (cues on hand placement)              Balance Overall balance assessment: Mild deficits observed, not formally tested                                         ADL either performed or assessed with clinical judgement   ADL Overall ADL's : Needs assistance/impaired Eating/Feeding: Independent;Sitting   Grooming: Wash/dry hands;Wash/dry face;Supervision/safety;Standing   Upper Body Bathing: Set up;Sitting;Cueing for safety;Cueing for sequencing   Lower Body Bathing: Cueing for safety;Cueing for sequencing;Minimal assistance   Upper Body Dressing : Set up;Sitting;Cueing for safety;Cueing for sequencing   Lower Body Dressing: Minimal assistance;Cueing for safety;Sit  to/from stand               Functional mobility during ADLs: Supervision/safety;Rolling walker General ADL Comments: cues on positioning     Vision   Vision Assessment?: No apparent visual deficits   Perception     Praxis      Cognition Arousal/Alertness: Awake/alert Behavior During Therapy: WFL for tasks assessed/performed Overall Cognitive Status: Within Functional Limits for tasks assessed                                          Exercises     Shoulder Instructions       General Comments      Pertinent Vitals/ Pain       Pain Assessment: 0-10 Pain Score: 7  Pain Location: back Pain Descriptors / Indicators: Aching;Shooting Pain Intervention(s): Limited activity within patient's tolerance  Home Living                                          Prior Functioning/Environment              Frequency  Min 2X/week        Progress Toward Goals  OT Goals(current goals can now be found in the care plan section)  Progress towards OT goals: Progressing toward goals  Acute Rehab OT Goals Patient Stated Goal: to decrease pain OT Goal Formulation: With patient Time For Goal Achievement: 05/16/21 Potential to Achieve Goals: Good ADL Goals Pt Will Perform Upper Body Bathing: with modified independence;sitting Pt Will Perform Lower Body Bathing: with modified independence;sit to/from stand Pt Will Perform Tub/Shower Transfer: with modified independence;shower seat;tub bench  Plan Discharge plan remains appropriate    Co-evaluation                 AM-PAC OT "6 Clicks" Daily Activity     Outcome Measure   Help from another person eating meals?: None Help from another person taking care of personal grooming?: A Little Help from another person toileting, which includes using toliet, bedpan, or urinal?: A Little Help from another person bathing (including washing, rinsing, drying)?: A Little Help from another  person to put on and taking off regular upper body clothing?: None Help from another person to put on and taking off regular lower body clothing?: A Little 6 Click Score: 20    End of Session Equipment Utilized During Treatment: Gait belt;Rolling walker  OT Visit Diagnosis: Unsteadiness on feet (R26.81);Other abnormalities of gait and mobility (R26.89);Muscle weakness (generalized) (M62.81);Pain   Activity Tolerance Patient limited by pain   Patient Left in chair;with call bell/phone within reach   Nurse Communication Other (comment) (current set up of pt and requesting food)        Time: 0713-0728 OT Time Calculation (min): 15 min  Charges: OT General Charges $OT Visit: 1 Visit OT Treatments $Self Care/Home Management : 8-22 mins  Joeseph Amor OTR/L  Acute Rehab Services  [phone] office number [phone] pager number    Joeseph Amor 05/13/2021, 7:35 AM

## 2021-05-14 DIAGNOSIS — Z7984 Long term (current) use of oral hypoglycemic drugs: Secondary | ICD-10-CM | POA: Diagnosis not present

## 2021-05-14 DIAGNOSIS — M48062 Spinal stenosis, lumbar region with neurogenic claudication: Secondary | ICD-10-CM | POA: Diagnosis not present

## 2021-05-14 DIAGNOSIS — Z4789 Encounter for other orthopedic aftercare: Secondary | ICD-10-CM | POA: Diagnosis not present

## 2021-05-14 DIAGNOSIS — R2681 Unsteadiness on feet: Secondary | ICD-10-CM | POA: Diagnosis not present

## 2021-05-14 DIAGNOSIS — E119 Type 2 diabetes mellitus without complications: Secondary | ICD-10-CM | POA: Diagnosis not present

## 2021-05-14 DIAGNOSIS — I10 Essential (primary) hypertension: Secondary | ICD-10-CM | POA: Diagnosis not present

## 2021-05-15 DIAGNOSIS — I10 Essential (primary) hypertension: Secondary | ICD-10-CM | POA: Diagnosis not present

## 2021-05-15 DIAGNOSIS — E119 Type 2 diabetes mellitus without complications: Secondary | ICD-10-CM | POA: Diagnosis not present

## 2021-05-15 DIAGNOSIS — R2681 Unsteadiness on feet: Secondary | ICD-10-CM | POA: Diagnosis not present

## 2021-05-15 DIAGNOSIS — M48062 Spinal stenosis, lumbar region with neurogenic claudication: Secondary | ICD-10-CM | POA: Diagnosis not present

## 2021-05-15 DIAGNOSIS — Z7984 Long term (current) use of oral hypoglycemic drugs: Secondary | ICD-10-CM | POA: Diagnosis not present

## 2021-05-15 DIAGNOSIS — Z4789 Encounter for other orthopedic aftercare: Secondary | ICD-10-CM | POA: Diagnosis not present

## 2021-05-19 DIAGNOSIS — Z7984 Long term (current) use of oral hypoglycemic drugs: Secondary | ICD-10-CM | POA: Diagnosis not present

## 2021-05-19 DIAGNOSIS — I10 Essential (primary) hypertension: Secondary | ICD-10-CM | POA: Diagnosis not present

## 2021-05-19 DIAGNOSIS — Z4789 Encounter for other orthopedic aftercare: Secondary | ICD-10-CM | POA: Diagnosis not present

## 2021-05-19 DIAGNOSIS — E119 Type 2 diabetes mellitus without complications: Secondary | ICD-10-CM | POA: Diagnosis not present

## 2021-05-19 DIAGNOSIS — M48062 Spinal stenosis, lumbar region with neurogenic claudication: Secondary | ICD-10-CM | POA: Diagnosis not present

## 2021-05-19 DIAGNOSIS — R2681 Unsteadiness on feet: Secondary | ICD-10-CM | POA: Diagnosis not present

## 2021-05-21 DIAGNOSIS — Z4789 Encounter for other orthopedic aftercare: Secondary | ICD-10-CM | POA: Diagnosis not present

## 2021-05-21 DIAGNOSIS — I10 Essential (primary) hypertension: Secondary | ICD-10-CM | POA: Diagnosis not present

## 2021-05-21 DIAGNOSIS — Z7984 Long term (current) use of oral hypoglycemic drugs: Secondary | ICD-10-CM | POA: Diagnosis not present

## 2021-05-21 DIAGNOSIS — R2681 Unsteadiness on feet: Secondary | ICD-10-CM | POA: Diagnosis not present

## 2021-05-21 DIAGNOSIS — E119 Type 2 diabetes mellitus without complications: Secondary | ICD-10-CM | POA: Diagnosis not present

## 2021-05-21 DIAGNOSIS — M48062 Spinal stenosis, lumbar region with neurogenic claudication: Secondary | ICD-10-CM | POA: Diagnosis not present

## 2021-05-26 DIAGNOSIS — Z4789 Encounter for other orthopedic aftercare: Secondary | ICD-10-CM | POA: Diagnosis not present

## 2021-05-26 DIAGNOSIS — Z7984 Long term (current) use of oral hypoglycemic drugs: Secondary | ICD-10-CM | POA: Diagnosis not present

## 2021-05-26 DIAGNOSIS — E119 Type 2 diabetes mellitus without complications: Secondary | ICD-10-CM | POA: Diagnosis not present

## 2021-05-26 DIAGNOSIS — M48062 Spinal stenosis, lumbar region with neurogenic claudication: Secondary | ICD-10-CM | POA: Diagnosis not present

## 2021-05-26 DIAGNOSIS — I10 Essential (primary) hypertension: Secondary | ICD-10-CM | POA: Diagnosis not present

## 2021-05-26 DIAGNOSIS — R2681 Unsteadiness on feet: Secondary | ICD-10-CM | POA: Diagnosis not present

## 2021-05-28 DIAGNOSIS — E119 Type 2 diabetes mellitus without complications: Secondary | ICD-10-CM | POA: Diagnosis not present

## 2021-05-28 DIAGNOSIS — Z7984 Long term (current) use of oral hypoglycemic drugs: Secondary | ICD-10-CM | POA: Diagnosis not present

## 2021-05-28 DIAGNOSIS — Z4789 Encounter for other orthopedic aftercare: Secondary | ICD-10-CM | POA: Diagnosis not present

## 2021-05-28 DIAGNOSIS — M48062 Spinal stenosis, lumbar region with neurogenic claudication: Secondary | ICD-10-CM | POA: Diagnosis not present

## 2021-05-28 DIAGNOSIS — I10 Essential (primary) hypertension: Secondary | ICD-10-CM | POA: Diagnosis not present

## 2021-05-28 DIAGNOSIS — R2681 Unsteadiness on feet: Secondary | ICD-10-CM | POA: Diagnosis not present

## 2021-05-29 DIAGNOSIS — I10 Essential (primary) hypertension: Secondary | ICD-10-CM | POA: Diagnosis not present

## 2021-05-29 DIAGNOSIS — E119 Type 2 diabetes mellitus without complications: Secondary | ICD-10-CM | POA: Diagnosis not present

## 2021-05-29 DIAGNOSIS — Z4789 Encounter for other orthopedic aftercare: Secondary | ICD-10-CM | POA: Diagnosis not present

## 2021-05-29 DIAGNOSIS — M48062 Spinal stenosis, lumbar region with neurogenic claudication: Secondary | ICD-10-CM | POA: Diagnosis not present

## 2021-05-29 DIAGNOSIS — R2681 Unsteadiness on feet: Secondary | ICD-10-CM | POA: Diagnosis not present

## 2021-05-29 DIAGNOSIS — Z7984 Long term (current) use of oral hypoglycemic drugs: Secondary | ICD-10-CM | POA: Diagnosis not present

## 2021-06-02 DIAGNOSIS — I10 Essential (primary) hypertension: Secondary | ICD-10-CM | POA: Diagnosis not present

## 2021-06-02 DIAGNOSIS — Z7984 Long term (current) use of oral hypoglycemic drugs: Secondary | ICD-10-CM | POA: Diagnosis not present

## 2021-06-02 DIAGNOSIS — Z4789 Encounter for other orthopedic aftercare: Secondary | ICD-10-CM | POA: Diagnosis not present

## 2021-06-02 DIAGNOSIS — E119 Type 2 diabetes mellitus without complications: Secondary | ICD-10-CM | POA: Diagnosis not present

## 2021-06-02 DIAGNOSIS — R2681 Unsteadiness on feet: Secondary | ICD-10-CM | POA: Diagnosis not present

## 2021-06-02 DIAGNOSIS — M48062 Spinal stenosis, lumbar region with neurogenic claudication: Secondary | ICD-10-CM | POA: Diagnosis not present

## 2021-06-03 DIAGNOSIS — E119 Type 2 diabetes mellitus without complications: Secondary | ICD-10-CM | POA: Diagnosis not present

## 2021-06-03 DIAGNOSIS — Z7984 Long term (current) use of oral hypoglycemic drugs: Secondary | ICD-10-CM | POA: Diagnosis not present

## 2021-06-03 DIAGNOSIS — I10 Essential (primary) hypertension: Secondary | ICD-10-CM | POA: Diagnosis not present

## 2021-06-03 DIAGNOSIS — Z4789 Encounter for other orthopedic aftercare: Secondary | ICD-10-CM | POA: Diagnosis not present

## 2021-06-03 DIAGNOSIS — R2681 Unsteadiness on feet: Secondary | ICD-10-CM | POA: Diagnosis not present

## 2021-06-03 DIAGNOSIS — M48062 Spinal stenosis, lumbar region with neurogenic claudication: Secondary | ICD-10-CM | POA: Diagnosis not present

## 2021-06-11 DIAGNOSIS — Z4789 Encounter for other orthopedic aftercare: Secondary | ICD-10-CM | POA: Diagnosis not present

## 2021-06-11 DIAGNOSIS — I10 Essential (primary) hypertension: Secondary | ICD-10-CM | POA: Diagnosis not present

## 2021-06-11 DIAGNOSIS — M48062 Spinal stenosis, lumbar region with neurogenic claudication: Secondary | ICD-10-CM | POA: Diagnosis not present

## 2021-06-11 DIAGNOSIS — R2681 Unsteadiness on feet: Secondary | ICD-10-CM | POA: Diagnosis not present

## 2021-06-11 DIAGNOSIS — Z7984 Long term (current) use of oral hypoglycemic drugs: Secondary | ICD-10-CM | POA: Diagnosis not present

## 2021-06-11 DIAGNOSIS — E119 Type 2 diabetes mellitus without complications: Secondary | ICD-10-CM | POA: Diagnosis not present

## 2021-07-20 DIAGNOSIS — K2100 Gastro-esophageal reflux disease with esophagitis, without bleeding: Secondary | ICD-10-CM | POA: Diagnosis not present

## 2021-07-20 DIAGNOSIS — J301 Allergic rhinitis due to pollen: Secondary | ICD-10-CM | POA: Diagnosis not present

## 2021-07-20 DIAGNOSIS — I10 Essential (primary) hypertension: Secondary | ICD-10-CM | POA: Diagnosis not present

## 2021-07-20 DIAGNOSIS — E039 Hypothyroidism, unspecified: Secondary | ICD-10-CM | POA: Diagnosis not present

## 2021-07-20 DIAGNOSIS — E7849 Other hyperlipidemia: Secondary | ICD-10-CM | POA: Diagnosis not present

## 2021-07-20 DIAGNOSIS — E1122 Type 2 diabetes mellitus with diabetic chronic kidney disease: Secondary | ICD-10-CM | POA: Diagnosis not present

## 2021-07-20 DIAGNOSIS — R4582 Worries: Secondary | ICD-10-CM | POA: Diagnosis not present

## 2021-07-22 ENCOUNTER — Other Ambulatory Visit: Payer: Medicare Other

## 2021-07-30 DIAGNOSIS — Z23 Encounter for immunization: Secondary | ICD-10-CM | POA: Diagnosis not present

## 2021-08-05 ENCOUNTER — Ambulatory Visit
Admission: RE | Admit: 2021-08-05 | Discharge: 2021-08-05 | Disposition: A | Discharge status: Home / Self Care | Payer: Medicare Other | Source: Ambulatory Visit | Attending: Family Medicine | Admitting: Family Medicine

## 2021-08-05 ENCOUNTER — Ambulatory Visit: Payer: Medicare Other

## 2021-08-05 ENCOUNTER — Other Ambulatory Visit: Payer: Self-pay | Admitting: Family Medicine

## 2021-08-05 ENCOUNTER — Other Ambulatory Visit: Payer: Self-pay

## 2021-08-05 DIAGNOSIS — R928 Other abnormal and inconclusive findings on diagnostic imaging of breast: Secondary | ICD-10-CM

## 2021-08-05 DIAGNOSIS — R922 Inconclusive mammogram: Secondary | ICD-10-CM | POA: Diagnosis not present

## 2021-08-21 DIAGNOSIS — N183 Chronic kidney disease, stage 3 unspecified: Secondary | ICD-10-CM | POA: Diagnosis not present

## 2021-08-21 DIAGNOSIS — I10 Essential (primary) hypertension: Secondary | ICD-10-CM | POA: Diagnosis not present

## 2021-08-21 DIAGNOSIS — E782 Mixed hyperlipidemia: Secondary | ICD-10-CM | POA: Diagnosis not present

## 2021-08-21 DIAGNOSIS — K2100 Gastro-esophageal reflux disease with esophagitis, without bleeding: Secondary | ICD-10-CM | POA: Diagnosis not present

## 2021-09-21 DIAGNOSIS — E782 Mixed hyperlipidemia: Secondary | ICD-10-CM | POA: Diagnosis not present

## 2021-09-21 DIAGNOSIS — I10 Essential (primary) hypertension: Secondary | ICD-10-CM | POA: Diagnosis not present

## 2021-09-21 DIAGNOSIS — N183 Chronic kidney disease, stage 3 unspecified: Secondary | ICD-10-CM | POA: Diagnosis not present

## 2021-09-21 DIAGNOSIS — K2100 Gastro-esophageal reflux disease with esophagitis, without bleeding: Secondary | ICD-10-CM | POA: Diagnosis not present

## 2021-09-30 DIAGNOSIS — E039 Hypothyroidism, unspecified: Secondary | ICD-10-CM | POA: Diagnosis not present

## 2021-09-30 DIAGNOSIS — W19XXXA Unspecified fall, initial encounter: Secondary | ICD-10-CM | POA: Diagnosis not present

## 2021-09-30 DIAGNOSIS — Z743 Need for continuous supervision: Secondary | ICD-10-CM | POA: Diagnosis not present

## 2021-09-30 DIAGNOSIS — S335XXA Sprain of ligaments of lumbar spine, initial encounter: Secondary | ICD-10-CM | POA: Diagnosis not present

## 2021-09-30 DIAGNOSIS — M542 Cervicalgia: Secondary | ICD-10-CM | POA: Diagnosis not present

## 2021-09-30 DIAGNOSIS — Z981 Arthrodesis status: Secondary | ICD-10-CM | POA: Diagnosis not present

## 2021-09-30 DIAGNOSIS — S39012A Strain of muscle, fascia and tendon of lower back, initial encounter: Secondary | ICD-10-CM | POA: Diagnosis not present

## 2021-09-30 DIAGNOSIS — I10 Essential (primary) hypertension: Secondary | ICD-10-CM | POA: Diagnosis not present

## 2021-09-30 DIAGNOSIS — S0093XA Contusion of unspecified part of head, initial encounter: Secondary | ICD-10-CM | POA: Diagnosis not present

## 2021-09-30 DIAGNOSIS — S0990XA Unspecified injury of head, initial encounter: Secondary | ICD-10-CM | POA: Diagnosis not present

## 2021-09-30 DIAGNOSIS — W108XXA Fall (on) (from) other stairs and steps, initial encounter: Secondary | ICD-10-CM | POA: Diagnosis not present

## 2021-09-30 DIAGNOSIS — M545 Low back pain, unspecified: Secondary | ICD-10-CM | POA: Diagnosis not present

## 2021-09-30 DIAGNOSIS — R0902 Hypoxemia: Secondary | ICD-10-CM | POA: Diagnosis not present

## 2021-09-30 DIAGNOSIS — M4326 Fusion of spine, lumbar region: Secondary | ICD-10-CM | POA: Diagnosis not present

## 2021-09-30 DIAGNOSIS — E119 Type 2 diabetes mellitus without complications: Secondary | ICD-10-CM | POA: Diagnosis not present

## 2021-09-30 DIAGNOSIS — R6889 Other general symptoms and signs: Secondary | ICD-10-CM | POA: Diagnosis not present

## 2021-09-30 DIAGNOSIS — S161XXA Strain of muscle, fascia and tendon at neck level, initial encounter: Secondary | ICD-10-CM | POA: Diagnosis not present

## 2021-10-21 DIAGNOSIS — K2100 Gastro-esophageal reflux disease with esophagitis, without bleeding: Secondary | ICD-10-CM | POA: Diagnosis not present

## 2021-10-21 DIAGNOSIS — I10 Essential (primary) hypertension: Secondary | ICD-10-CM | POA: Diagnosis not present

## 2021-10-21 DIAGNOSIS — N183 Chronic kidney disease, stage 3 unspecified: Secondary | ICD-10-CM | POA: Diagnosis not present

## 2021-10-21 DIAGNOSIS — E782 Mixed hyperlipidemia: Secondary | ICD-10-CM | POA: Diagnosis not present

## 2021-12-08 ENCOUNTER — Emergency Department (HOSPITAL_COMMUNITY)
Admission: EM | Admit: 2021-12-08 | Discharge: 2021-12-09 | Disposition: A | Discharge status: Home / Self Care | Payer: 59 | Attending: Emergency Medicine | Admitting: Emergency Medicine

## 2021-12-08 ENCOUNTER — Emergency Department (HOSPITAL_COMMUNITY): Payer: 59

## 2021-12-08 ENCOUNTER — Encounter (HOSPITAL_COMMUNITY): Payer: Self-pay | Admitting: Emergency Medicine

## 2021-12-08 DIAGNOSIS — S0990XA Unspecified injury of head, initial encounter: Secondary | ICD-10-CM | POA: Insufficient documentation

## 2021-12-08 DIAGNOSIS — S299XXA Unspecified injury of thorax, initial encounter: Secondary | ICD-10-CM | POA: Insufficient documentation

## 2021-12-08 DIAGNOSIS — Y900 Blood alcohol level of less than 20 mg/100 ml: Secondary | ICD-10-CM | POA: Insufficient documentation

## 2021-12-08 DIAGNOSIS — Z79899 Other long term (current) drug therapy: Secondary | ICD-10-CM | POA: Diagnosis not present

## 2021-12-08 DIAGNOSIS — R109 Unspecified abdominal pain: Secondary | ICD-10-CM | POA: Insufficient documentation

## 2021-12-08 DIAGNOSIS — Y92410 Unspecified street and highway as the place of occurrence of the external cause: Secondary | ICD-10-CM | POA: Diagnosis not present

## 2021-12-08 LAB — CBC WITH DIFFERENTIAL/PLATELET
Abs Immature Granulocytes: 0.05 10*3/uL (ref 0.00–0.07)
Basophils Absolute: 0 10*3/uL (ref 0.0–0.1)
Basophils Relative: 0 %
Eosinophils Absolute: 0.1 10*3/uL (ref 0.0–0.5)
Eosinophils Relative: 1 %
HCT: 36.9 % (ref 36.0–46.0)
Hemoglobin: 12.1 g/dL (ref 12.0–15.0)
Immature Granulocytes: 0 %
Lymphocytes Relative: 18 %
Lymphs Abs: 2.5 10*3/uL (ref 0.7–4.0)
MCH: 28.1 pg (ref 26.0–34.0)
MCHC: 32.8 g/dL (ref 30.0–36.0)
MCV: 85.6 fL (ref 80.0–100.0)
Monocytes Absolute: 0.7 10*3/uL (ref 0.1–1.0)
Monocytes Relative: 5 %
Neutro Abs: 10.2 10*3/uL — ABNORMAL HIGH (ref 1.7–7.7)
Neutrophils Relative %: 76 %
Platelets: 284 10*3/uL (ref 150–400)
RBC: 4.31 MIL/uL (ref 3.87–5.11)
RDW: 13.6 % (ref 11.5–15.5)
WBC: 13.5 10*3/uL — ABNORMAL HIGH (ref 4.0–10.5)
nRBC: 0 % (ref 0.0–0.2)

## 2021-12-08 LAB — COMPREHENSIVE METABOLIC PANEL
ALT: 11 U/L (ref 0–44)
AST: 15 U/L (ref 15–41)
Albumin: 3.7 g/dL (ref 3.5–5.0)
Alkaline Phosphatase: 95 U/L (ref 38–126)
Anion gap: 11 (ref 5–15)
BUN: 19 mg/dL (ref 8–23)
CO2: 23 mmol/L (ref 22–32)
Calcium: 9.7 mg/dL (ref 8.9–10.3)
Chloride: 107 mmol/L (ref 98–111)
Creatinine, Ser: 1.26 mg/dL — ABNORMAL HIGH (ref 0.44–1.00)
GFR, Estimated: 46 mL/min — ABNORMAL LOW (ref >60)
Glucose, Bld: 189 mg/dL — ABNORMAL HIGH (ref 70–99)
Potassium: 4.3 mmol/L (ref 3.5–5.1)
Sodium: 141 mmol/L (ref 135–145)
Total Bilirubin: 0.6 mg/dL (ref 0.3–1.2)
Total Protein: 6.8 g/dL (ref 6.5–8.1)

## 2021-12-08 LAB — ETHANOL: Alcohol, Ethyl (B): <10 mg/dL (ref <10)

## 2021-12-08 MED ORDER — OXYCODONE-ACETAMINOPHEN 5-325 MG PO TABS: 1.0000 | ORAL_TABLET | Freq: Once | ORAL | Status: DC
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
## 2021-12-09 ENCOUNTER — Encounter (HOSPITAL_COMMUNITY): Payer: Self-pay | Admitting: Emergency Medicine

## 2021-12-09 ENCOUNTER — Emergency Department (HOSPITAL_COMMUNITY): Payer: 59

## 2021-12-09 DIAGNOSIS — S299XXA Unspecified injury of thorax, initial encounter: Secondary | ICD-10-CM | POA: Diagnosis not present

## 2021-12-09 MED ORDER — ACETAMINOPHEN 500 MG PO TABS
1000.0000 mg | ORAL_TABLET | Freq: Once | ORAL | Status: AC
  Administered 2021-12-09: 1000 mg via ORAL
  Filled 2021-12-09: qty 2

## 2021-12-09 MED ORDER — LIDOCAINE 5 % EX PTCH: 1.0000 | MEDICATED_PATCH | CUTANEOUS | 0 refills | Status: AC

## 2021-12-09 MED ORDER — NAPROXEN 250 MG PO TABS
500.0000 mg | ORAL_TABLET | Freq: Once | ORAL | Status: AC
  Administered 2021-12-09: 500 mg via ORAL
  Filled 2021-12-09: qty 2

## 2021-12-09 MED ORDER — LIDOCAINE 5 % EX PTCH
2.0000 | MEDICATED_PATCH | CUTANEOUS | Status: DC
  Administered 2021-12-09: 2 via TRANSDERMAL
  Filled 2021-12-09: qty 2

## 2021-12-09 MED ORDER — MELOXICAM 7.5 MG PO TABS: 7.5000 mg | ORAL_TABLET | Freq: Every day | ORAL | 0 refills | Status: AC

## 2021-12-09 MED ORDER — IOHEXOL 300 MG/ML  SOLN
80.0000 mL | Freq: Once | INTRAMUSCULAR | Status: AC | PRN
  Administered 2021-12-09: 80 mL via INTRAVENOUS

## 2021-12-09 NOTE — ED Provider Notes (Signed)
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 considered in my medical decision making (see chart for details). After history, exam, and medical workup I feel the patient has been appropriately medically screened and is safe for discharge home. Pertinent diagnoses were discussed with the patient. Patient was given return precautions.      Rx / DC Orders ED Discharge Orders          Ordered    lidocaine (LIDODERM) 5 %  Every 24 hours        12/09/21 0538    meloxicam (MOBIC) 7.5 MG tablet  Daily        12/09/21 0538              Vash Quezada, MD 12/09/21 0543

## 2021-12-09 NOTE — ED Notes (Signed)
RN reviewed discharge instructions with pt. Pt verbalized understanding and had no further questions. VSS upon discharge.  

## 2022-02-03 ENCOUNTER — Inpatient Hospital Stay: Admission: RE | Admit: 2022-02-03 | Payer: Medicare Other | Source: Ambulatory Visit

## 2022-02-17 DIAGNOSIS — I10 Essential (primary) hypertension: Secondary | ICD-10-CM | POA: Diagnosis not present

## 2022-02-17 DIAGNOSIS — J301 Allergic rhinitis due to pollen: Secondary | ICD-10-CM | POA: Diagnosis not present

## 2022-02-17 DIAGNOSIS — E039 Hypothyroidism, unspecified: Secondary | ICD-10-CM | POA: Diagnosis not present

## 2022-02-17 DIAGNOSIS — E7849 Other hyperlipidemia: Secondary | ICD-10-CM | POA: Diagnosis not present

## 2022-02-17 DIAGNOSIS — N1831 Chronic kidney disease, stage 3a: Secondary | ICD-10-CM | POA: Diagnosis not present

## 2022-02-17 DIAGNOSIS — K2100 Gastro-esophageal reflux disease with esophagitis, without bleeding: Secondary | ICD-10-CM | POA: Diagnosis not present

## 2022-02-17 DIAGNOSIS — R4582 Worries: Secondary | ICD-10-CM | POA: Diagnosis not present

## 2022-02-17 DIAGNOSIS — E1122 Type 2 diabetes mellitus with diabetic chronic kidney disease: Secondary | ICD-10-CM | POA: Diagnosis not present

## 2022-02-17 DIAGNOSIS — Z0001 Encounter for general adult medical examination with abnormal findings: Secondary | ICD-10-CM | POA: Diagnosis not present

## 2022-03-02 DIAGNOSIS — R7989 Other specified abnormal findings of blood chemistry: Secondary | ICD-10-CM | POA: Diagnosis not present

## 2022-03-02 DIAGNOSIS — N183 Chronic kidney disease, stage 3 unspecified: Secondary | ICD-10-CM | POA: Diagnosis not present

## 2022-03-04 DIAGNOSIS — M48062 Spinal stenosis, lumbar region with neurogenic claudication: Secondary | ICD-10-CM | POA: Diagnosis not present

## 2022-03-04 DIAGNOSIS — G5600 Carpal tunnel syndrome, unspecified upper limb: Secondary | ICD-10-CM | POA: Diagnosis not present

## 2022-03-21 DIAGNOSIS — K2100 Gastro-esophageal reflux disease with esophagitis, without bleeding: Secondary | ICD-10-CM | POA: Diagnosis not present

## 2022-03-21 DIAGNOSIS — I10 Essential (primary) hypertension: Secondary | ICD-10-CM | POA: Diagnosis not present

## 2022-03-21 DIAGNOSIS — E782 Mixed hyperlipidemia: Secondary | ICD-10-CM | POA: Diagnosis not present

## 2022-03-21 DIAGNOSIS — N183 Chronic kidney disease, stage 3 unspecified: Secondary | ICD-10-CM | POA: Diagnosis not present

## 2022-04-21 DIAGNOSIS — I10 Essential (primary) hypertension: Secondary | ICD-10-CM | POA: Diagnosis not present

## 2022-04-21 DIAGNOSIS — E782 Mixed hyperlipidemia: Secondary | ICD-10-CM | POA: Diagnosis not present

## 2022-04-21 DIAGNOSIS — N183 Chronic kidney disease, stage 3 unspecified: Secondary | ICD-10-CM | POA: Diagnosis not present

## 2022-04-21 DIAGNOSIS — K2100 Gastro-esophageal reflux disease with esophagitis, without bleeding: Secondary | ICD-10-CM | POA: Diagnosis not present

## 2022-05-08 ENCOUNTER — Encounter (HOSPITAL_COMMUNITY): Payer: Self-pay | Admitting: Emergency Medicine

## 2022-05-08 ENCOUNTER — Emergency Department (HOSPITAL_COMMUNITY): Payer: 59

## 2022-05-08 ENCOUNTER — Other Ambulatory Visit: Payer: Self-pay

## 2022-05-08 ENCOUNTER — Emergency Department (HOSPITAL_COMMUNITY)
Admission: EM | Admit: 2022-05-08 | Discharge: 2022-05-08 | Disposition: A | Discharge status: Home / Self Care | Payer: 59 | Attending: Emergency Medicine | Admitting: Emergency Medicine

## 2022-05-08 DIAGNOSIS — W1830XA Fall on same level, unspecified, initial encounter: Secondary | ICD-10-CM | POA: Diagnosis not present

## 2022-05-08 DIAGNOSIS — Z79899 Other long term (current) drug therapy: Secondary | ICD-10-CM | POA: Insufficient documentation

## 2022-05-08 DIAGNOSIS — S0990XA Unspecified injury of head, initial encounter: Secondary | ICD-10-CM | POA: Diagnosis present

## 2022-05-08 DIAGNOSIS — E119 Type 2 diabetes mellitus without complications: Secondary | ICD-10-CM | POA: Diagnosis not present

## 2022-05-08 DIAGNOSIS — Z7984 Long term (current) use of oral hypoglycemic drugs: Secondary | ICD-10-CM | POA: Diagnosis not present

## 2022-05-08 DIAGNOSIS — S0083XA Contusion of other part of head, initial encounter: Secondary | ICD-10-CM | POA: Insufficient documentation

## 2022-05-08 DIAGNOSIS — W19XXXA Unspecified fall, initial encounter: Secondary | ICD-10-CM

## 2022-05-08 DIAGNOSIS — I10 Essential (primary) hypertension: Secondary | ICD-10-CM | POA: Diagnosis not present

## 2022-05-08 DIAGNOSIS — R1032 Left lower quadrant pain: Secondary | ICD-10-CM | POA: Insufficient documentation

## 2022-05-08 LAB — BASIC METABOLIC PANEL
Anion gap: 9 (ref 5–15)
BUN: 26 mg/dL — ABNORMAL HIGH (ref 8–23)
CO2: 22 mmol/L (ref 22–32)
Calcium: 8.6 mg/dL — ABNORMAL LOW (ref 8.9–10.3)
Chloride: 104 mmol/L (ref 98–111)
Creatinine, Ser: 1.53 mg/dL — ABNORMAL HIGH (ref 0.44–1.00)
GFR, Estimated: 36 mL/min — ABNORMAL LOW (ref >60)
Glucose, Bld: 218 mg/dL — ABNORMAL HIGH (ref 70–99)
Potassium: 4.1 mmol/L (ref 3.5–5.1)
Sodium: 135 mmol/L (ref 135–145)

## 2022-05-08 LAB — CBC WITH DIFFERENTIAL/PLATELET
Abs Immature Granulocytes: 0.02 10*3/uL (ref 0.00–0.07)
Basophils Absolute: 0 10*3/uL (ref 0.0–0.1)
Basophils Relative: 0 %
Eosinophils Absolute: 0.2 10*3/uL (ref 0.0–0.5)
Eosinophils Relative: 3 %
HCT: 33.7 % — ABNORMAL LOW (ref 36.0–46.0)
Hemoglobin: 10.9 g/dL — ABNORMAL LOW (ref 12.0–15.0)
Immature Granulocytes: 0 %
Lymphocytes Relative: 27 %
Lymphs Abs: 2.3 10*3/uL (ref 0.7–4.0)
MCH: 27.9 pg (ref 26.0–34.0)
MCHC: 32.3 g/dL (ref 30.0–36.0)
MCV: 86.4 fL (ref 80.0–100.0)
Monocytes Absolute: 0.6 10*3/uL (ref 0.1–1.0)
Monocytes Relative: 7 %
Neutro Abs: 5.4 10*3/uL (ref 1.7–7.7)
Neutrophils Relative %: 63 %
Platelets: 240 10*3/uL (ref 150–400)
RBC: 3.9 MIL/uL (ref 3.87–5.11)
RDW: 14.8 % (ref 11.5–15.5)
WBC: 8.5 10*3/uL (ref 4.0–10.5)
nRBC: 0 % (ref 0.0–0.2)

## 2022-05-08 MED ORDER — IOHEXOL 300 MG/ML  SOLN
100.0000 mL | Freq: Once | INTRAMUSCULAR | Status: AC | PRN
  Administered 2022-05-08: 100 mL via INTRAVENOUS

## 2022-05-08 MED ORDER — HYDROCODONE-ACETAMINOPHEN 5-325 MG PO TABS
1.0000 | ORAL_TABLET | Freq: Once | ORAL | Status: AC
  Administered 2022-05-08: 1 via ORAL
  Filled 2022-05-08: qty 1

## 2022-05-08 NOTE — ED Provider Notes (Signed)
[facility] Provider Note   CSN: [id number] Arrival date & time: 05/08/22  1542     History {Add pertinent medical, surgical, social history, OB history to HPI:1} Chief Complaint  Patient presents with   [name] is a 71 y.o. female.  Patient has a history of diabetes and hypertension.  She had a fall today   Fall       Home Medications Prior to Admission medications   Medication Sig Start Date End Date Taking? Authorizing Provider  albuterol (VENTOLIN HFA) 108 (90 Base) MCG/ACT inhaler Inhale 2 puffs into the lungs every 4 (four) hours as needed for wheezing or shortness of breath.    [provider]  amLODipine (NORVASC) 5 MG tablet Take 5 mg by mouth daily. 03/12/21   [provider]  Ascorbic Acid (VITAMIN C) 500 MG CHEW Chew 500 mg by mouth daily as needed (during winter season).    [provider]  atenolol (TENORMIN) 25 MG tablet Take 1 tablet (25 mg total) by mouth daily. 12/24/15   Francine Graven, DO  busPIRone (BUSPAR) 15 MG tablet Take 15 mg by mouth 3 (three) times daily as needed for anxiety. 04/24/21   [provider]  cyclobenzaprine (FLEXERIL) 10 MG tablet Take 1 tablet (10 mg total) by mouth 3 (three) times daily as needed for muscle spasms. 05/12/21   Loral Part, MD  diclofenac Sodium (VOLTAREN) 1 % GEL Apply 2 g topically 4 (four) times daily as needed. 07/30/20   Long, Wonda Olds, MD  diphenhydrAMINE (BENADRYL) 25 MG tablet Take 25 mg by mouth every 6 (six) hours as needed for allergies.    [provider]  docusate sodium (COLACE) 100 MG capsule Take 1 capsule (100 mg total) by mouth 2 (two) times daily. 05/12/21   Maisee Part, MD  DULoxetine (CYMBALTA) 30 MG capsule Take 30 mg by mouth in the morning. 01/26/21   [provider]  levothyroxine (SYNTHROID) 50 MCG tablet Take 50 mcg by mouth daily before breakfast.    [provider]  lidocaine  (LIDODERM) 5 % Place 1 patch onto the skin daily. Remove & Discard patch within 12 hours or as directed by MD 12/09/21   Randal Buba, April, MD  meloxicam (MOBIC) 7.5 MG tablet Take 1 tablet (7.5 mg total) by mouth daily. 12/09/21   Palumbo, April, MD  metFORMIN (GLUCOPHAGE) 1000 MG tablet Take 1 tablet (1,000 mg total) by mouth 2 (two) times daily with a meal. 10/14/15   Lily Kocher, PA-C  oxyCODONE 10 MG TABS Take 1 tablet (10 mg total) by mouth every 4 (four) hours as needed for severe pain ((score 7 to 10)). 05/12/21   Astrid Part, MD  pantoprazole (PROTONIX) 40 MG tablet Take 40 mg by mouth daily as needed for heartburn. 04/01/21   [provider]  pravastatin (PRAVACHOL) 20 MG tablet Take 20 mg by mouth daily.    [provider]  tamsulosin (FLOMAX) 0.4 MG CAPS capsule Take 1 capsule (0.4 mg total) by mouth daily with supper. 05/13/21   Minsa Part, MD  traZODone (DESYREL) 100 MG tablet Take 100 mg by mouth at bedtime as needed for sleep. 02/27/21   [provider]  valsartan (DIOVAN) 160 MG tablet Take 160 mg by mouth daily.    [provider]      Allergies    Sulfa antibiotics and Sulfonamide derivatives    Review of Systems  Review of Systems  Physical Exam Updated Vital Signs BP (!) 124/51   Pulse 71   Temp 98 F (36.7 C) (Oral)   Resp 19   Ht 4' 9.5" (1.461 m)   Wt 72.6 kg   SpO2 93%   BMI 34.02 kg/m  Physical Exam  ED Results / Procedures / Treatments   Labs (all labs ordered are listed, but only abnormal results are displayed) Labs Reviewed  CBC WITH DIFFERENTIAL/PLATELET - Abnormal; Notable for the following components:      Result Value   Hemoglobin 10.9 (*)    HCT 33.7 (*)    All other components within normal limits  BASIC METABOLIC PANEL - Abnormal; Notable for the following components:   Glucose, Bld 218 (*)    BUN 26 (*)    Creatinine, Ser 1.53 (*)    Calcium 8.6 (*)    GFR, Estimated 36 (*)    All other  components within normal limits    EKG None  Radiology DG Chest Port 1 View  Result Date: 05/08/2022 CLINICAL DATA:  Fall, left flank pain EXAM: PORTABLE CHEST 1 VIEW COMPARISON:  None Available. FINDINGS: The heart size and mediastinal contours are within normal limits. Both lungs are clear. The visualized skeletal structures are unremarkable. IMPRESSION: No active disease. Electronically Signed   By: Fidela Salisbury M.D.   On: 05/08/2022 22:20   CT Head Wo Contrast  Result Date: 05/08/2022 CLINICAL DATA:  Ataxia, cervical trauma; Head trauma, intracranial arterial injury suspected. Patient c/o left side flank pain and headache after tripping over concrete car bumper/stop. Patient reports hitting head but denies any nausea, vomiting, or dizziness. Denies taking any type of anticoagulants. Provider did not want to wait on labs. EXAM: CT HEAD WITHOUT CONTRAST CT CERVICAL SPINE WITHOUT CONTRAST TECHNIQUE: Multidetector CT imaging of the head and cervical spine was performed following the standard protocol without intravenous contrast. Multiplanar CT image reconstructions of the cervical spine were also generated. RADIATION DOSE REDUCTION: This exam was performed according to the departmental dose-optimization program which includes automated exposure control, adjustment of the mA and/or kV according to patient size and/or use of iterative reconstruction technique. COMPARISON:  CT head 12/09/2021, CT cervical spine 03/12/2011 FINDINGS: CT HEAD FINDINGS BRAIN: BRAIN Patchy and confluent areas of decreased attenuation are noted throughout the deep and periventricular white matter of the cerebral hemispheres bilaterally, compatible with chronic microvascular ischemic disease. No evidence of large-territorial acute infarction. No parenchymal hemorrhage. No mass lesion. No extra-axial collection. No mass effect or midline shift. No hydrocephalus. Basilar cisterns are patent. Vascular: No hyperdense vessel.  Atherosclerotic calcifications are present within the cavernous internal carotid arteries. Skull: No acute fracture or focal lesion. Sinuses/Orbits: Paranasal sinuses and mastoid air cells are clear. The orbits are unremarkable. Other: None. CT CERVICAL SPINE FINDINGS Alignment: Normal. Skull base and vertebrae: Multilevel facet arthropathy. No acute fracture. No aggressive appearing focal osseous lesion or focal pathologic process. Soft tissues and spinal canal: No prevertebral fluid or swelling. No visible canal hematoma. Upper chest: Unremarkable. Other: Atherosclerotic plaque of the aortic arch and its major branches. IMPRESSION: 1. No acute intracranial abnormality. 2. No acute displaced fracture or traumatic listhesis of the cervical spine. 3.  Aortic Atherosclerosis (ICD10-I70.0). Electronically Signed   By: Iven Finn M.D.   On: 05/08/2022 22:14   CT Cervical Spine Wo Contrast  Result Date: 05/08/2022 CLINICAL DATA:  Ataxia, cervical trauma; Head trauma, intracranial arterial injury suspected. Patient c/o left side flank pain and headache  after tripping over concrete car bumper/stop. Patient reports hitting head but denies any nausea, vomiting, or dizziness. Denies taking any type of anticoagulants. Provider did not want to wait on labs. EXAM: CT HEAD WITHOUT CONTRAST CT CERVICAL SPINE WITHOUT CONTRAST TECHNIQUE: Multidetector CT imaging of the head and cervical spine was performed following the standard protocol without intravenous contrast. Multiplanar CT image reconstructions of the cervical spine were also generated. RADIATION DOSE REDUCTION: This exam was performed according to the departmental dose-optimization program which includes automated exposure control, adjustment of the mA and/or kV according to patient size and/or use of iterative reconstruction technique. COMPARISON:  CT head 12/09/2021, CT cervical spine 03/12/2011 FINDINGS: CT HEAD FINDINGS BRAIN: BRAIN Patchy and confluent areas  of decreased attenuation are noted throughout the deep and periventricular white matter of the cerebral hemispheres bilaterally, compatible with chronic microvascular ischemic disease. No evidence of large-territorial acute infarction. No parenchymal hemorrhage. No mass lesion. No extra-axial collection. No mass effect or midline shift. No hydrocephalus. Basilar cisterns are patent. Vascular: No hyperdense vessel. Atherosclerotic calcifications are present within the cavernous internal carotid arteries. Skull: No acute fracture or focal lesion. Sinuses/Orbits: Paranasal sinuses and mastoid air cells are clear. The orbits are unremarkable. Other: None. CT CERVICAL SPINE FINDINGS Alignment: Normal. Skull base and vertebrae: Multilevel facet arthropathy. No acute fracture. No aggressive appearing focal osseous lesion or focal pathologic process. Soft tissues and spinal canal: No prevertebral fluid or swelling. No visible canal hematoma. Upper chest: Unremarkable. Other: Atherosclerotic plaque of the aortic arch and its major branches. IMPRESSION: 1. No acute intracranial abnormality. 2. No acute displaced fracture or traumatic listhesis of the cervical spine. 3.  Aortic Atherosclerosis (ICD10-I70.0). Electronically Signed   By: Iven Finn M.D.   On: 05/08/2022 22:14   CT ABDOMEN PELVIS W CONTRAST  Result Date: 05/08/2022 CLINICAL DATA:  Blunt abdominal trauma. Left-sided flank pain and headache after trip and fall. EXAM: CT ABDOMEN AND PELVIS WITH CONTRAST TECHNIQUE: Multidetector CT imaging of the abdomen and pelvis was performed using the standard protocol following bolus administration of intravenous contrast. RADIATION DOSE REDUCTION: This exam was performed according to the departmental dose-optimization program which includes automated exposure control, adjustment of the mA and/or kV according to patient size and/or use of iterative reconstruction technique. CONTRAST:  146m OMNIPAQUE IOHEXOL 300 MG/ML   SOLN COMPARISON:  12/09/2021 FINDINGS: Lower chest: Mild dependent atelectasis. 2 mm nodules are demonstrated in both lung bases. No change since prior study. Hepatobiliary: No hepatic injury or perihepatic hematoma. Gallbladder is unremarkable Pancreas: Unremarkable. No pancreatic ductal dilatation or surrounding inflammatory changes. Spleen: No splenic injury or perisplenic hematoma. Adrenals/Urinary Tract: No adrenal hemorrhage or renal injury identified. Bladder is unremarkable. Stomach/Bowel: Stomach is within normal limits. Appendix appears normal. No evidence of bowel wall thickening, distention, or inflammatory changes. Duodenal diverticulum. Vascular/Lymphatic: Aortic atherosclerosis. No enlarged abdominal or pelvic lymph nodes. Reproductive: No abnormal pelvic mass lesions. Other: No abdominal wall hernia or abnormality. No abdominopelvic ascites. Musculoskeletal: Postoperative changes in the lower lumbar spine. Degenerative changes in the spine and hips. No acute fractures are identified. IMPRESSION: 1. No acute posttraumatic changes are demonstrated in the abdomen or pelvis. No evidence of solid organ injury or bowel perforation. 2. Multiple pulmonary nodules. Most severe: 2 mm solid pulmonary nodule. No routine follow-up imaging is recommended per Fleischner Society Guidelines. These guidelines do not apply to immunocompromised patients and patients with cancer. Follow up in patients with significant comorbidities as clinically warranted. For lung cancer screening, adhere to  Lung-RADS guidelines. Reference: Radiology. 2017; 284(1):228-43. 3. Aortic atherosclerosis. Electronically Signed   By: Lucienne Capers M.D.   On: 05/08/2022 22:14    Procedures Procedures  {Document cardiac monitor, telemetry assessment procedure when appropriate:1}  Medications Ordered in ED Medications  iohexol (OMNIPAQUE) 300 MG/ML solution 100 mL (100 mLs Intravenous Contrast Given 05/08/22 2118)    ED Course/  Medical Decision Making/ A&P                           Medical Decision Making Amount and/or Complexity of Data Reviewed Labs: ordered. Radiology: ordered.  Risk Prescription drug management.   Patient with a fall causing contusion to head and left flank  {Document critical care time when appropriate:1} {Document review of labs and clinical decision tools ie heart score, Chads2Vasc2 etc:1}  {Document your independent review of radiology images, and any outside records:1} {Document your discussion with family members, caretakers, and with consultants:1} {Document social determinants of health affecting pt's care:1} {Document your decision making why or why not admission, treatments were needed:1} Final Clinical Impression(s) / ED Diagnoses Final diagnoses:  Fall, initial encounter    Rx / DC Orders ED Discharge Orders     None

## 2022-05-08 NOTE — ED Triage Notes (Signed)
Patient c/o left side flank pain and headache after tripping over concrete car bumper/stop. Patient reports hitting head but denies any nausea, vomiting, or dizziness. Denies taking any type of anticoagulants.

## 2022-05-08 NOTE — Discharge Instructions (Signed)
Take Tylenol or Motrin for pain.  Follow-up with your doctor next week for recheck.  He will also follow-up on those lung lesions that were seen

## 2022-07-22 DIAGNOSIS — N1831 Chronic kidney disease, stage 3a: Secondary | ICD-10-CM | POA: Diagnosis not present

## 2022-07-22 DIAGNOSIS — E039 Hypothyroidism, unspecified: Secondary | ICD-10-CM | POA: Diagnosis not present

## 2022-08-04 DIAGNOSIS — E1122 Type 2 diabetes mellitus with diabetic chronic kidney disease: Secondary | ICD-10-CM | POA: Diagnosis not present

## 2022-08-04 DIAGNOSIS — I10 Essential (primary) hypertension: Secondary | ICD-10-CM | POA: Diagnosis not present

## 2022-08-04 DIAGNOSIS — K2100 Gastro-esophageal reflux disease with esophagitis, without bleeding: Secondary | ICD-10-CM | POA: Diagnosis not present

## 2022-08-04 DIAGNOSIS — N1831 Chronic kidney disease, stage 3a: Secondary | ICD-10-CM | POA: Diagnosis not present

## 2022-08-04 DIAGNOSIS — E7849 Other hyperlipidemia: Secondary | ICD-10-CM | POA: Diagnosis not present

## 2022-08-04 DIAGNOSIS — E039 Hypothyroidism, unspecified: Secondary | ICD-10-CM | POA: Diagnosis not present

## 2022-08-04 DIAGNOSIS — J301 Allergic rhinitis due to pollen: Secondary | ICD-10-CM | POA: Diagnosis not present

## 2022-08-04 DIAGNOSIS — Z23 Encounter for immunization: Secondary | ICD-10-CM | POA: Diagnosis not present

## 2022-09-16 DIAGNOSIS — E119 Type 2 diabetes mellitus without complications: Secondary | ICD-10-CM | POA: Diagnosis not present

## 2022-09-16 DIAGNOSIS — I10 Essential (primary) hypertension: Secondary | ICD-10-CM | POA: Diagnosis not present

## 2022-09-28 IMAGING — DX DG LUMBAR SPINE COMPLETE 4+V
5 series · 5 of 5 positions shown · non-contrast
Comparison: CT lumbar spine 09/20/2020. chest x-ray 07/30/2020

CLINICAL DATA: Status post fall

EXAM:
LUMBAR SPINE - COMPLETE 4+ VIEW

[l-spine ap]
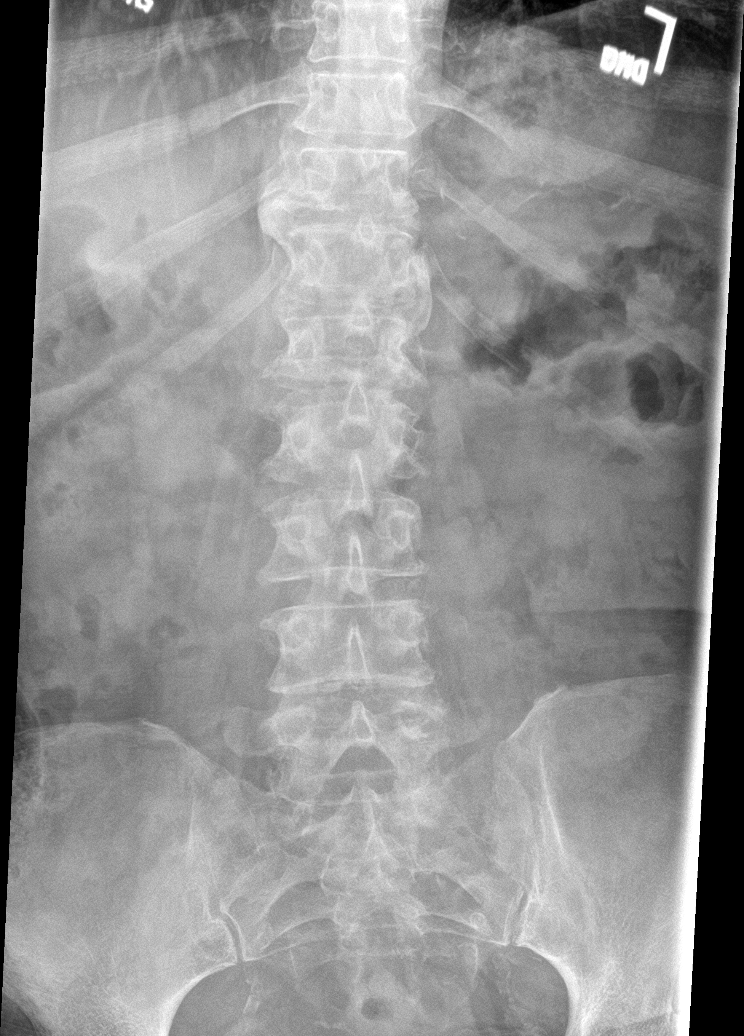

[l-spine obl (1 of 2)]
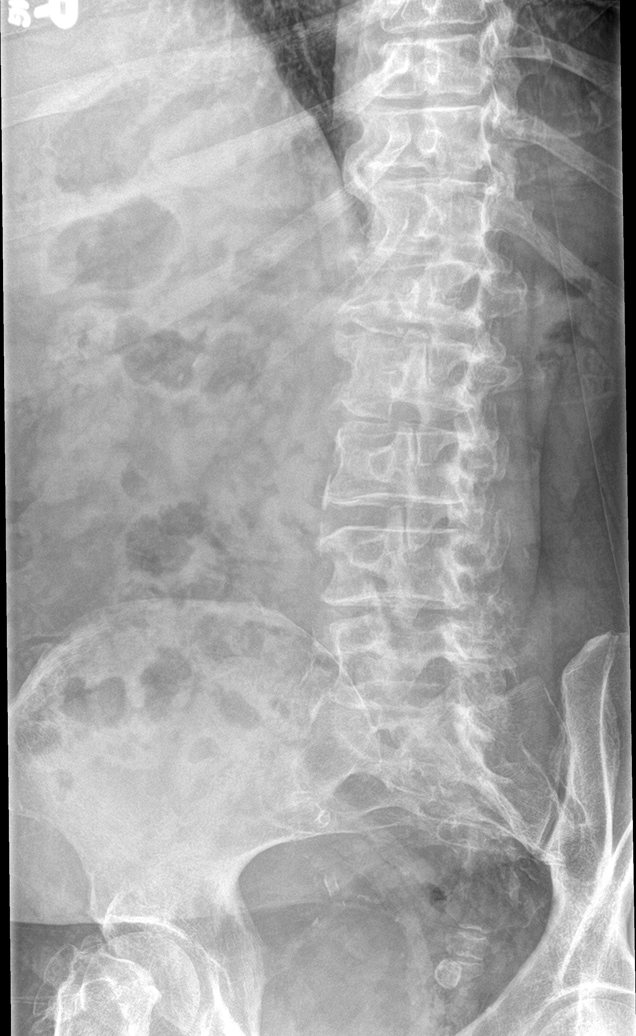

[l-spine obl (2 of 2)]
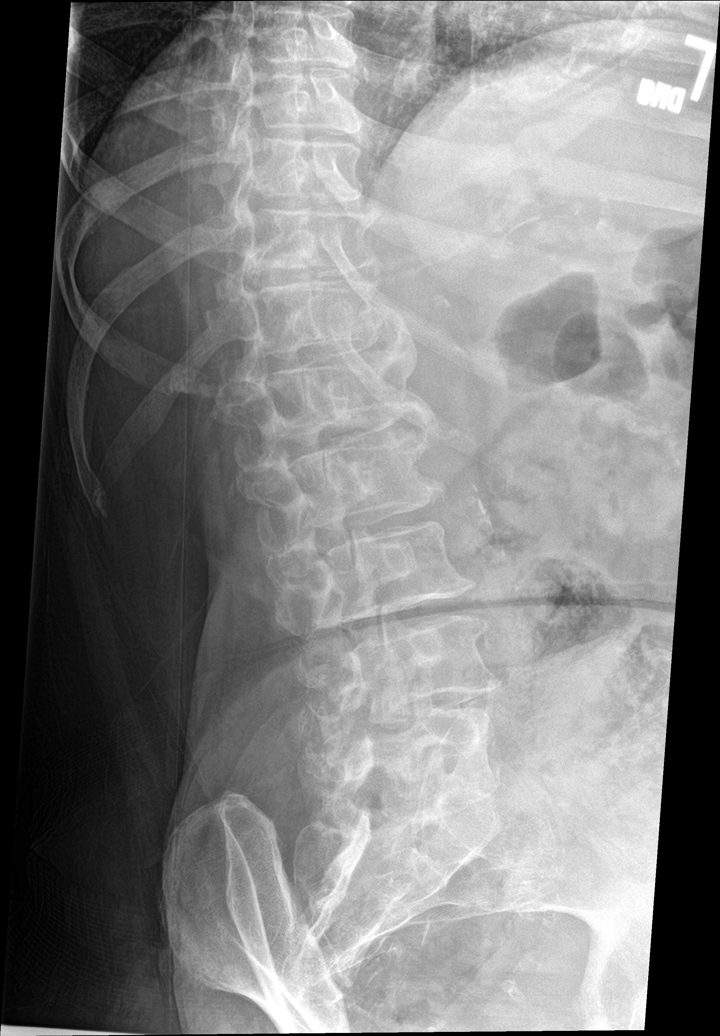

[l-spine lat]
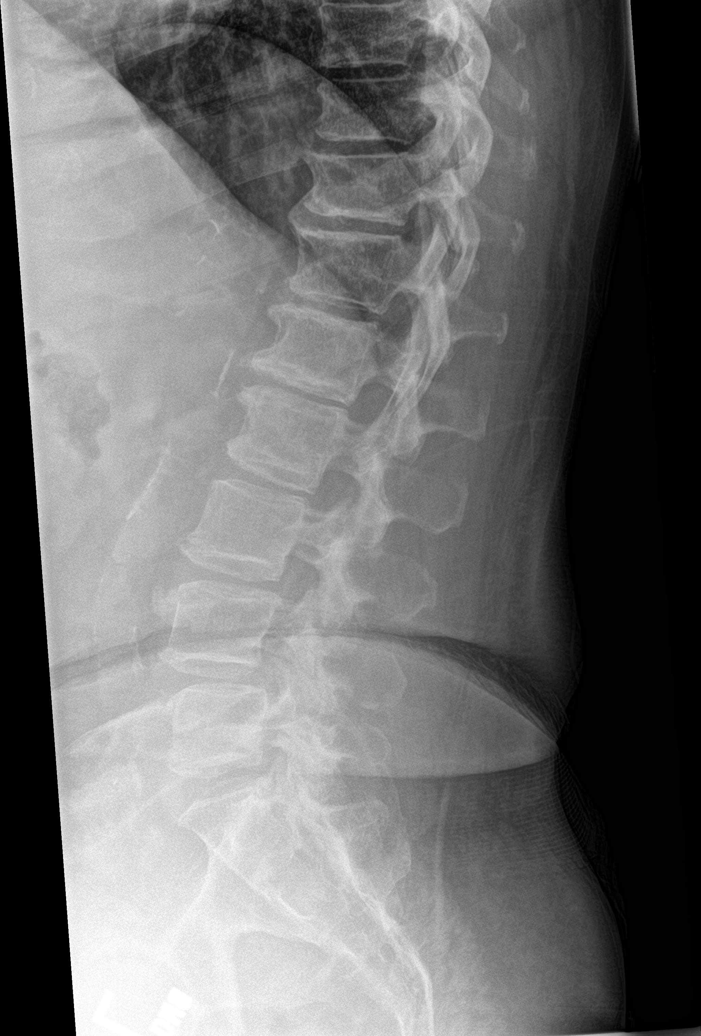

[l-spine spot]
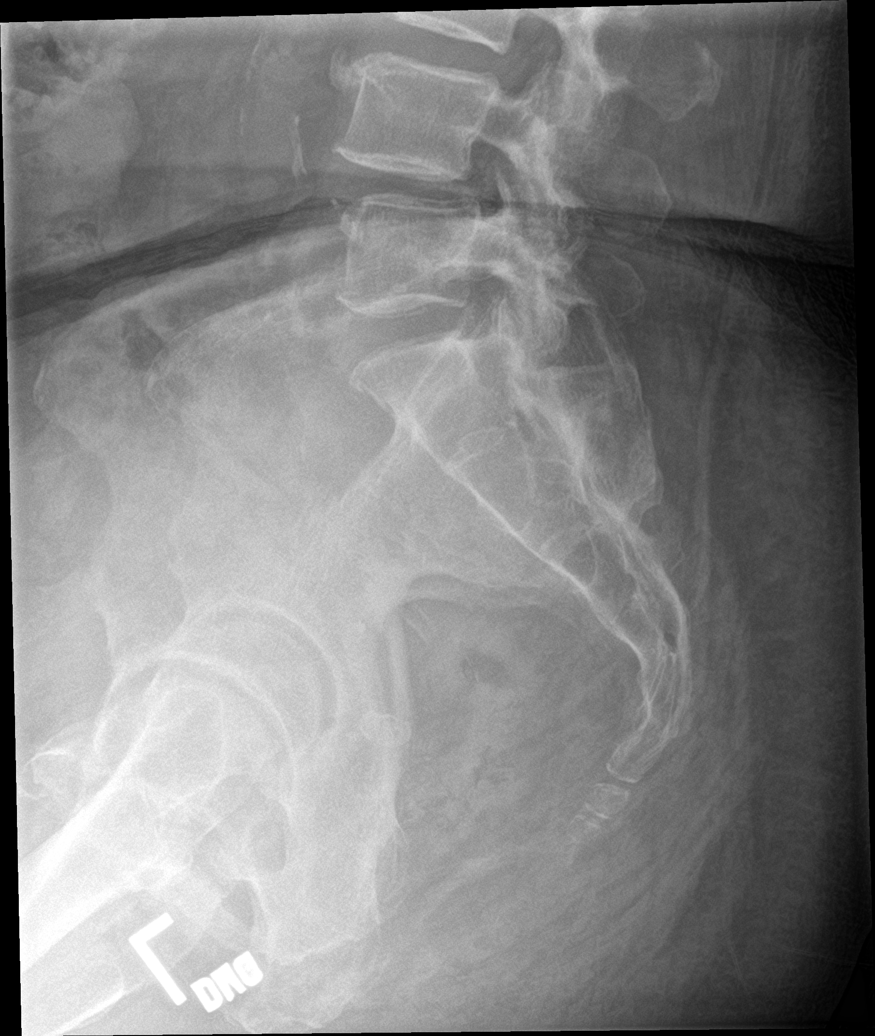

[5 of 5 positions shown; findings below may reference images not displayed]

FINDINGS: Five non-rib-bearing lumbar vertebral bodies.

Multilevel, at least moderate in severity, degenerative changes of
the visualized portions of the thoracolumbar spine. There is no
evidence of lumbar spine fracture. Stable grade 1 anterolisthesis of
L4 on L5. Otherwise alignment is normal. Intervertebral disc spaces
are maintained at the lumbar levels. Aortic atherosclerotic plaque.
IMPRESSION: No acute displaced fracture or traumatic listhesis of the lumbar
spine.

## 2022-10-01 DIAGNOSIS — E1142 Type 2 diabetes mellitus with diabetic polyneuropathy: Secondary | ICD-10-CM | POA: Diagnosis not present

## 2022-10-01 DIAGNOSIS — E1122 Type 2 diabetes mellitus with diabetic chronic kidney disease: Secondary | ICD-10-CM | POA: Diagnosis not present

## 2022-10-01 DIAGNOSIS — E7849 Other hyperlipidemia: Secondary | ICD-10-CM | POA: Diagnosis not present

## 2022-10-01 DIAGNOSIS — I10 Essential (primary) hypertension: Secondary | ICD-10-CM | POA: Diagnosis not present

## 2022-10-01 DIAGNOSIS — R7989 Other specified abnormal findings of blood chemistry: Secondary | ICD-10-CM | POA: Diagnosis not present

## 2022-10-01 DIAGNOSIS — E1165 Type 2 diabetes mellitus with hyperglycemia: Secondary | ICD-10-CM | POA: Diagnosis not present

## 2022-10-01 DIAGNOSIS — K219 Gastro-esophageal reflux disease without esophagitis: Secondary | ICD-10-CM | POA: Diagnosis not present

## 2022-10-07 DIAGNOSIS — R4582 Worries: Secondary | ICD-10-CM | POA: Diagnosis not present

## 2022-10-07 DIAGNOSIS — J301 Allergic rhinitis due to pollen: Secondary | ICD-10-CM | POA: Diagnosis not present

## 2022-10-07 DIAGNOSIS — E1122 Type 2 diabetes mellitus with diabetic chronic kidney disease: Secondary | ICD-10-CM | POA: Diagnosis not present

## 2022-10-07 DIAGNOSIS — Z23 Encounter for immunization: Secondary | ICD-10-CM | POA: Diagnosis not present

## 2022-10-07 DIAGNOSIS — J329 Chronic sinusitis, unspecified: Secondary | ICD-10-CM | POA: Diagnosis not present

## 2022-10-07 DIAGNOSIS — K2100 Gastro-esophageal reflux disease with esophagitis, without bleeding: Secondary | ICD-10-CM | POA: Diagnosis not present

## 2022-10-07 DIAGNOSIS — I10 Essential (primary) hypertension: Secondary | ICD-10-CM | POA: Diagnosis not present

## 2022-10-07 DIAGNOSIS — E7849 Other hyperlipidemia: Secondary | ICD-10-CM | POA: Diagnosis not present

## 2022-10-07 DIAGNOSIS — E039 Hypothyroidism, unspecified: Secondary | ICD-10-CM | POA: Diagnosis not present

## 2023-08-29 DIAGNOSIS — N1831 Chronic kidney disease, stage 3a: Secondary | ICD-10-CM | POA: Diagnosis not present

## 2023-08-29 DIAGNOSIS — Z23 Encounter for immunization: Secondary | ICD-10-CM | POA: Diagnosis not present

## 2023-08-29 DIAGNOSIS — E7849 Other hyperlipidemia: Secondary | ICD-10-CM | POA: Diagnosis not present

## 2023-08-29 DIAGNOSIS — I10 Essential (primary) hypertension: Secondary | ICD-10-CM | POA: Diagnosis not present

## 2023-08-29 DIAGNOSIS — E1122 Type 2 diabetes mellitus with diabetic chronic kidney disease: Secondary | ICD-10-CM | POA: Diagnosis not present

## 2023-08-29 DIAGNOSIS — J449 Chronic obstructive pulmonary disease, unspecified: Secondary | ICD-10-CM | POA: Diagnosis not present

## 2023-08-29 DIAGNOSIS — E039 Hypothyroidism, unspecified: Secondary | ICD-10-CM | POA: Diagnosis not present

## 2023-08-29 DIAGNOSIS — R4582 Worries: Secondary | ICD-10-CM | POA: Diagnosis not present

## 2023-08-29 DIAGNOSIS — E1165 Type 2 diabetes mellitus with hyperglycemia: Secondary | ICD-10-CM | POA: Diagnosis not present

## 2023-08-29 DIAGNOSIS — K2100 Gastro-esophageal reflux disease with esophagitis, without bleeding: Secondary | ICD-10-CM | POA: Diagnosis not present

## 2023-08-29 DIAGNOSIS — E559 Vitamin D deficiency, unspecified: Secondary | ICD-10-CM | POA: Diagnosis not present

## 2023-08-30 DIAGNOSIS — E1122 Type 2 diabetes mellitus with diabetic chronic kidney disease: Secondary | ICD-10-CM | POA: Diagnosis not present

## 2023-09-13 DIAGNOSIS — I10 Essential (primary) hypertension: Secondary | ICD-10-CM | POA: Diagnosis not present

## 2023-10-27 DIAGNOSIS — J019 Acute sinusitis, unspecified: Secondary | ICD-10-CM | POA: Diagnosis not present

## 2023-10-27 DIAGNOSIS — R059 Cough, unspecified: Secondary | ICD-10-CM | POA: Diagnosis not present

## 2023-12-22 DIAGNOSIS — N1831 Chronic kidney disease, stage 3a: Secondary | ICD-10-CM | POA: Diagnosis not present

## 2023-12-22 DIAGNOSIS — E1122 Type 2 diabetes mellitus with diabetic chronic kidney disease: Secondary | ICD-10-CM | POA: Diagnosis not present

## 2023-12-22 DIAGNOSIS — Z13220 Encounter for screening for lipoid disorders: Secondary | ICD-10-CM | POA: Diagnosis not present

## 2023-12-22 DIAGNOSIS — E1165 Type 2 diabetes mellitus with hyperglycemia: Secondary | ICD-10-CM | POA: Diagnosis not present

## 2023-12-22 DIAGNOSIS — I10 Essential (primary) hypertension: Secondary | ICD-10-CM | POA: Diagnosis not present

## 2024-04-12 DIAGNOSIS — Z1231 Encounter for screening mammogram for malignant neoplasm of breast: Secondary | ICD-10-CM | POA: Diagnosis not present

## 2024-04-12 DIAGNOSIS — Z1389 Encounter for screening for other disorder: Secondary | ICD-10-CM | POA: Diagnosis not present

## 2024-04-12 DIAGNOSIS — I10 Essential (primary) hypertension: Secondary | ICD-10-CM | POA: Diagnosis not present

## 2024-04-12 DIAGNOSIS — E1122 Type 2 diabetes mellitus with diabetic chronic kidney disease: Secondary | ICD-10-CM | POA: Diagnosis not present

## 2024-04-12 DIAGNOSIS — Z0001 Encounter for general adult medical examination with abnormal findings: Secondary | ICD-10-CM | POA: Diagnosis not present

## 2024-04-12 DIAGNOSIS — Z23 Encounter for immunization: Secondary | ICD-10-CM | POA: Diagnosis not present

## 2024-04-12 DIAGNOSIS — K2100 Gastro-esophageal reflux disease with esophagitis, without bleeding: Secondary | ICD-10-CM | POA: Diagnosis not present

## 2024-04-12 DIAGNOSIS — N1831 Chronic kidney disease, stage 3a: Secondary | ICD-10-CM | POA: Diagnosis not present

## 2024-09-07 DIAGNOSIS — Z23 Encounter for immunization: Secondary | ICD-10-CM | POA: Diagnosis not present

## 2024-09-11 DIAGNOSIS — R350 Frequency of micturition: Secondary | ICD-10-CM | POA: Diagnosis not present
